# Patient Record
Sex: Male | Born: 1960 | Race: White | Hispanic: No | Marital: Married
Health system: Midwestern US, Community
[De-identification: ages and names within clinical notes are randomized; demographics above are authoritative.]

## PROBLEM LIST (undated history)

## (undated) DIAGNOSIS — M1712 Unilateral primary osteoarthritis, left knee: Secondary | ICD-10-CM

## (undated) DIAGNOSIS — Z96652 Presence of left artificial knee joint: Secondary | ICD-10-CM

## (undated) DIAGNOSIS — Z01818 Encounter for other preprocedural examination: Secondary | ICD-10-CM

## (undated) DIAGNOSIS — Z96651 Presence of right artificial knee joint: Secondary | ICD-10-CM

## (undated) DIAGNOSIS — I10 Essential (primary) hypertension: Secondary | ICD-10-CM

## (undated) DIAGNOSIS — N419 Inflammatory disease of prostate, unspecified: Secondary | ICD-10-CM

## (undated) DIAGNOSIS — M51369 Other intervertebral disc degeneration, lumbar region without mention of lumbar back pain or lower extremity pain: Secondary | ICD-10-CM

## (undated) DIAGNOSIS — F988 Other specified behavioral and emotional disorders with onset usually occurring in childhood and adolescence: Secondary | ICD-10-CM

## (undated) DIAGNOSIS — M5136 Other intervertebral disc degeneration, lumbar region: Secondary | ICD-10-CM

## (undated) DIAGNOSIS — K429 Umbilical hernia without obstruction or gangrene: Secondary | ICD-10-CM

## (undated) HISTORY — PX: LUMBAR DISC SURGERY: SHX700

## (undated) HISTORY — DX: Other specified behavioral and emotional disorders with onset usually occurring in childhood and adolescence: F98.8

## (undated) HISTORY — DX: Other intervertebral disc degeneration, lumbar region: M51.36

## (undated) HISTORY — DX: Essential (primary) hypertension: I10

## (undated) HISTORY — DX: Umbilical hernia without obstruction or gangrene: K42.9

## (undated) HISTORY — DX: Inflammatory disease of prostate, unspecified: N41.9

## (undated) HISTORY — DX: Other intervertebral disc degeneration, lumbar region without mention of lumbar back pain or lower extremity pain: M51.369

---

## 2003-10-19 ENCOUNTER — Emergency Department (HOSPITAL_COMMUNITY): Admission: EM | Admit: 2003-10-19 | Discharge: 2003-10-19 | Payer: Self-pay | Admitting: Emergency Medicine

## 2005-12-02 ENCOUNTER — Ambulatory Visit (HOSPITAL_COMMUNITY): Admission: RE | Admit: 2005-12-02 | Discharge: 2005-12-02 | Payer: Self-pay | Admitting: Orthopedic Surgery

## 2006-08-27 ENCOUNTER — Encounter: Admission: RE | Admit: 2006-08-27 | Discharge: 2006-08-27 | Payer: Self-pay | Admitting: Endocrinology

## 2010-05-09 ENCOUNTER — Encounter: Payer: Self-pay | Admitting: Endocrinology

## 2010-05-17 ENCOUNTER — Encounter: Admission: RE | Admit: 2010-05-17 | Payer: Self-pay | Source: Home / Self Care | Admitting: Endocrinology

## 2010-05-17 ENCOUNTER — Other Ambulatory Visit: Payer: Self-pay | Admitting: Endocrinology

## 2010-05-17 DIAGNOSIS — M545 Low back pain: Secondary | ICD-10-CM

## 2010-05-19 ENCOUNTER — Encounter: Payer: Self-pay | Admitting: Endocrinology

## 2010-05-22 ENCOUNTER — Encounter: Payer: Self-pay | Admitting: Endocrinology

## 2010-06-17 ENCOUNTER — Other Ambulatory Visit: Payer: Self-pay

## 2010-07-09 ENCOUNTER — Ambulatory Visit
Admission: RE | Admit: 2010-07-09 | Discharge: 2010-07-09 | Disposition: A | Discharge status: Home / Self Care | Payer: 59 | Source: Ambulatory Visit | Attending: Endocrinology | Admitting: Endocrinology

## 2010-07-09 DIAGNOSIS — M545 Low back pain: Secondary | ICD-10-CM

## 2010-07-09 MED ORDER — GADOBENATE DIMEGLUMINE 529 MG/ML IV SOLN
20.0000 mL | Freq: Once | INTRAVENOUS | Status: AC | PRN
  Administered 2010-07-09: 20 mL via INTRAVENOUS

## 2011-01-12 ENCOUNTER — Ambulatory Visit (HOSPITAL_BASED_OUTPATIENT_CLINIC_OR_DEPARTMENT_OTHER)
Admission: RE | Admit: 2011-01-12 | Discharge: 2011-01-12 | Disposition: A | Discharge status: Home / Self Care | Payer: 59 | Source: Ambulatory Visit | Attending: Orthopedic Surgery | Admitting: Orthopedic Surgery

## 2011-01-12 DIAGNOSIS — X58XXXA Exposure to other specified factors, initial encounter: Secondary | ICD-10-CM | POA: Insufficient documentation

## 2011-01-12 DIAGNOSIS — Z79899 Other long term (current) drug therapy: Secondary | ICD-10-CM | POA: Insufficient documentation

## 2011-01-12 DIAGNOSIS — M2240 Chondromalacia patellae, unspecified knee: Secondary | ICD-10-CM | POA: Insufficient documentation

## 2011-01-12 DIAGNOSIS — R9431 Abnormal electrocardiogram [ECG] [EKG]: Secondary | ICD-10-CM | POA: Insufficient documentation

## 2011-01-12 DIAGNOSIS — IMO0002 Reserved for concepts with insufficient information to code with codable children: Secondary | ICD-10-CM | POA: Insufficient documentation

## 2011-01-26 NOTE — Op Note (Signed)
Bobby House, Bobby House            ACCOUNT NO.:  [id number]  MEDICAL RECORD NO.:  [id number]  LOCATION:                                 FACILITY:  PHYSICIAN:  Ollen Gross, M.D.         DATE OF BIRTH:  DATE OF PROCEDURE:  01/12/2011 DATE OF DISCHARGE:                              OPERATIVE REPORT   PREOPERATIVE DIAGNOSIS:  Left knee medial meniscal tear.  POSTOPERATIVE DIAGNOSIS:  Left knee medial meniscal tear plus chondral defects.  PROCEDURE:  Left knee arthroscopy, meniscal debridement, and chondroplasty.  SURGEON:  Ollen Gross, M.D.  ASSISTANT:  None.  ANESTHESIA:  General.  ESTIMATED BLOOD LOSS:  Minimal.  DRAINS:  None.  COMPLICATIONS:  None.  CONDITION:  Stable to recovery.  BRIEF CLINICAL NOTE:  The patient is a 50 year old male with a several- month history of significant left knee pain and mechanical symptoms. Exam and history suggested a medial meniscal tear confirmed by MRI.  He presents for arthroscopy and debridement.  PROCEDURE IN DETAIL:  After successful administration of general anesthetic, a tourniquet was placed high on the left thigh, and his left lower extremity was prepped and draped in the usual sterile fashion. Standard superomedial and inferolateral incisions were made, in flow cannula passed, superomedial camera passed inferolateral.  Arthroscopic visualization proceeds.  Undersurface of the patella had some grade 2 and 3 changes.  There was no exposed bone.  There was about 1 x 1 cm area superior and central.  The trochlea looks fairly normal.  Medial and lateral gutters were visualized.  There were no loose bodies. Flexion valgus force was applied to the knee and medial compartment was centered.  There was evidence of a real significant unstable tear of the body and posterior horn of the medial meniscus with a flap component. There was also exposed bone over about a 2 x 2 cm area of the medial femoral condyle with a smaller lesion  on the medial tibial plateau. There was unstable cartilage through this defect.  The spinal needle was used to localize the inferomedial portal and small incision was made, dilator placed.  The meniscus was debrided back to stable base with baskets and a 4.2-mm shaver and sealed off with the ArthroCare device. Collene Mares was also used to debride the unstable cartilage off the medial femoral condyle.  The lesion overall was to 2 x 2 cm.  I then abraded it with the shaver in hopes of getting some fibrocartilage formation.  The rest of the medial compartment looks acceptable.  Intercondylar notch was visualized.  The ACL was intact.  Lateral compartment centered looks normal.  The joint was again inspected.  No other tears, defects, or loose bodies noted.  I did debride the undersurface of the patella to get an unstable area back to a stable base.  The arthroscopic equipment was then removed in the inferior portals, which were closed with interrupted 4-0 nylon.  A 20 cc of 0.25% Marcaine with epi was injected through the inflow cannula and that is removed and that portal closed with nylon.  Bulky sterile dressing was applied and he is awakened and transported to Recovery in stable condition.  Ollen Gross, M.D.     FA/MEDQ  D:  01/12/2011  T:  01/13/2011  Job:  [id number]  Electronically Signed by Ollen Gross M.D. on 01/26/2011 11:18:05 AM

## 2012-09-05 ENCOUNTER — Telehealth: Payer: Self-pay | Admitting: Family Medicine

## 2012-09-05 NOTE — Telephone Encounter (Signed)
PATIENT'S WIFE SAYS HE NEEDS REFILLS AND THAT HE IS READY TO COME AND TALK TO DR. ZANARD.  SHE SAYS THAT NEXT AVAILABLE APPOINTMENT ON 09/14/2012 IS TOO LATE.  HE NEEDS TO BE SEEN MUCH SOONER.

## 2012-09-05 NOTE — Telephone Encounter (Signed)
He can come tomorrow at either 11:45 or 4:00.  I left a message in his wife's voice mail. PG

## 2012-09-06 ENCOUNTER — Encounter: Payer: Self-pay | Admitting: Family Medicine

## 2012-09-06 ENCOUNTER — Ambulatory Visit (INDEPENDENT_AMBULATORY_CARE_PROVIDER_SITE_OTHER): Payer: Commercial Indemnity | Admitting: Family Medicine

## 2012-09-06 VITALS — BP 144/95 | HR 71 | Ht 73.0 in | Wt 250.0 lb

## 2012-09-06 DIAGNOSIS — F988 Other specified behavioral and emotional disorders with onset usually occurring in childhood and adolescence: Secondary | ICD-10-CM

## 2012-09-06 DIAGNOSIS — F4323 Adjustment disorder with mixed anxiety and depressed mood: Secondary | ICD-10-CM

## 2012-09-06 MED ORDER — LISDEXAMFETAMINE DIMESYLATE 70 MG PO CAPS: 70.0000 mg | ORAL_CAPSULE | ORAL | Status: DC

## 2012-09-06 MED ORDER — ESCITALOPRAM OXALATE 10 MG PO TABS: 10.0000 mg | ORAL_TABLET | Freq: Every day | ORAL | Status: DC

## 2012-09-06 NOTE — Progress Notes (Signed)
  Subjective:    Patient ID: Bobby House, male    DOB: Dec 26, 1960, 52 y.o.   MRN: [id number]  HPI  Kyheem is here today to get a refill on his Vyvanse. He is doing fine on his current dosage.  He is eating and sleeping fine. He has been extremely stressed since his last visit.  His brother died unexpectedly a couple of months ago, his mom has just been diagnosed with stage IV lung cancer and his aunt died a few days ago.    Review of Systems  Constitutional: Negative for activity change and unexpected weight change.  Respiratory: Negative for chest tightness.   Cardiovascular: Negative for chest pain.  Psychiatric/Behavioral: Positive for sleep disturbance, dysphoric mood and decreased concentration. The patient is nervous/anxious.    Past Medical History  Diagnosis Date  . ADD (attention deficit disorder)   . Prostatitis   . Umbilical hernia   . DDD (degenerative disc disease), lumbar   . Hypertension    Family History  Problem Relation Age of Onset  . Cancer Mother     Lung Cancer  . Hypertension Father   . Heart disease Paternal Grandfather    History   Social History Narrative   Marital Status: Married Engineering geologist)    Children: Daughter Perrin Maltese); Son Public librarian)    Pets: Dogs (2); Cat (1)    Living Situation: Lives with wife and children   Occupation: Environmental consultant (Scientist, water quality)   Education: Engineer, maintenance (IT)    Alcohol Use:  Social   Tobacco Use:  None    Drug Use:  None   Diet:  Regular   Exercise:  Daily       Objective:   Physical Exam  Constitutional: He appears well-nourished.  HENT:  Head: Normocephalic.  Nose: Nose normal.  Mouth/Throat: Oropharynx is clear and moist.  Eyes: Conjunctivae are normal. No scleral icterus.  Neck: Neck supple. No thyromegaly present.  Cardiovascular: Normal rate, regular rhythm and normal heart sounds.   Pulmonary/Chest: Effort normal and breath sounds normal.  Abdominal: Soft. He exhibits no mass. There is  no tenderness.  Musculoskeletal: Normal range of motion.  Lymphadenopathy:    He has no cervical adenopathy.  Neurological: He is alert.  Skin: Skin is warm and dry. No rash noted.  Psychiatric: He has a normal mood and affect. His behavior is normal. Judgment and thought content normal.       Assessment & Plan:

## 2012-09-06 NOTE — Patient Instructions (Addendum)
1)  ADD - Stay on the Vyvanse.  2)  Mood - Start with 1/2 of the Lexapro for 1-2 weeks then increase to 1 tab if needed. If you do great on the 5 mg then you can stay on that or can even lower it to 2.5 mg (1/4 tab) if needed.    Grief Reaction Grief is a normal response to the death of someone close to you. Feelings of fear, anger, and guilt can affect almost everyone who loses someone they love. Symptoms of depression are also common. These include problems with sleep, loss of appetite, and lack of energy. These grief reaction symptoms often last for weeks to months after a loss. They may also return during special times that remind you of the person you lost, such as an anniversary or birthday. Anxiety, insomnia, irritability, and deep depression may last beyond the period of normal grief. If you experience these feelings for 6 months or longer, you may have clinical depression. Clinical depression requires further medical attention. If you think that you have clinical depression, you should contact your caregiver. If you have a history of depression and or a family history of depression, you are at greater risk of clinical depression. You are also at greater risk of developing clinical depression if the loss was traumatic or the loss was of someone with whom you had unresolved issues.  A grief reaction can become complicated by being blocked. This means being unable to cry or express extreme emotions. This may prolong the grieving period and worsen the emotional effects of the loss. Mourning is a natural event in human life. A healthy grief reaction is one that is not blocked . It requires a time of sadness and readjustment.It is very important to share your sorrow and fear with others, especially close friends and family. Professional counselors and clergy can also help you process your grief. Document Released: 04/04/2005 Document Revised: 06/27/2011 Document Reviewed: 12/13/2005 [facility] Patient  Information 2014 [place], [place]. Anxiety and Panic Attacks Your caregiver has informed you that you are having an anxiety or panic attack. There may be many forms of this. Most of the time these attacks come suddenly and without warning. They come at any time of day, including periods of sleep, and at any time of life. They may be strong and unexplained. Although panic attacks are very scary, they are physically harmless. Sometimes the cause of your anxiety is not known. Anxiety is a protective mechanism of the body in its fight or flight mechanism. Most of these perceived danger situations are actually nonphysical situations (such as anxiety over losing a job). CAUSES  The causes of an anxiety or panic attack are many. Panic attacks may occur in otherwise healthy people given a certain set of circumstances. There may be a genetic cause for panic attacks. Some medications may also have anxiety as a side effect. SYMPTOMS  Some of the most common feelings are:  Intense terror.  Dizziness, feeling faint.  Hot and cold flashes.  Fear of going crazy.  Feelings that nothing is real.  Sweating.  Shaking.  Chest pain or a fast heartbeat (palpitations).  Smothering, choking sensations.  Feelings of impending doom and that death is near.  Tingling of extremities, this may be from over-breathing.  Altered reality (derealization).  Being detached from yourself (depersonalization). Several symptoms can be present to make up anxiety or panic attacks. DIAGNOSIS  The evaluation by your caregiver will depend on the type of symptoms you are  experiencing. The diagnosis of anxiety or panic attack is made when no physical illness can be determined to be a cause of the symptoms. TREATMENT  Treatment to prevent anxiety and panic attacks may include:  Avoidance of circumstances that cause anxiety.  Reassurance and relaxation.  Regular exercise.  Relaxation therapies, such as  yoga.  Psychotherapy with a psychiatrist or therapist.  Avoidance of caffeine, alcohol and illegal drugs.  Prescribed medication. SEEK IMMEDIATE MEDICAL CARE IF:   You experience panic attack symptoms that are different than your usual symptoms.  You have any worsening or concerning symptoms. Document Released: 04/04/2005 Document Revised: 06/27/2011 Document Reviewed: 08/06/2009 [facility] Patient Information 2014 [place], [place]. 1```

## 2012-09-19 ENCOUNTER — Encounter: Payer: Self-pay | Admitting: Family Medicine

## 2012-09-19 DIAGNOSIS — F988 Other specified behavioral and emotional disorders with onset usually occurring in childhood and adolescence: Secondary | ICD-10-CM | POA: Insufficient documentation

## 2012-09-19 DIAGNOSIS — F4323 Adjustment disorder with mixed anxiety and depressed mood: Secondary | ICD-10-CM | POA: Insufficient documentation

## 2012-09-19 NOTE — Assessment & Plan Note (Signed)
Refilled his Vyvanse.

## 2012-09-19 NOTE — Assessment & Plan Note (Signed)
He was given a prescription for Lexapro.

## 2013-01-09 ENCOUNTER — Ambulatory Visit (INDEPENDENT_AMBULATORY_CARE_PROVIDER_SITE_OTHER): Payer: Commercial Indemnity | Admitting: Family Medicine

## 2013-01-09 ENCOUNTER — Encounter: Payer: Self-pay | Admitting: Family Medicine

## 2013-01-09 VITALS — BP 138/92 | HR 73 | Resp 16 | Ht 73.0 in | Wt 250.0 lb

## 2013-01-09 DIAGNOSIS — F411 Generalized anxiety disorder: Secondary | ICD-10-CM | POA: Insufficient documentation

## 2013-01-09 DIAGNOSIS — F988 Other specified behavioral and emotional disorders with onset usually occurring in childhood and adolescence: Secondary | ICD-10-CM | POA: Insufficient documentation

## 2013-01-09 DIAGNOSIS — F4323 Adjustment disorder with mixed anxiety and depressed mood: Secondary | ICD-10-CM

## 2013-01-09 MED ORDER — ESCITALOPRAM OXALATE 10 MG PO TABS: 10.0000 mg | ORAL_TABLET | Freq: Every day | ORAL | Status: DC

## 2013-01-09 MED ORDER — LISDEXAMFETAMINE DIMESYLATE 70 MG PO CAPS: 70.0000 mg | ORAL_CAPSULE | ORAL | Status: DC

## 2013-01-09 NOTE — Assessment & Plan Note (Signed)
Refilled his Lexapro for 6 months.

## 2013-01-09 NOTE — Patient Instructions (Addendum)
1)  ADD - Stay on your current dosage of Vyvanse.  F/U in 3 months.    2)  Depression/Anxiety - Bobby House seems to be handling his mother's death okay.  I think that he is very glad that she is no longer suffering.    3)  Vaccination - He was encouraged to get a flu shot. He is awaiting a voucher from his insurance.

## 2013-01-09 NOTE — Progress Notes (Signed)
  Subjective:    Patient ID: Bobby House, male    DOB: December 14, 1960, 52 y.o.   MRN: [id number]  HPI   Bobby House is here today to get a refills on his Vyvanse and Lexapro.  He feels that his dosages are appropriate.  His mother recently passed away and he admits to feeling more "down" but he says that he is doing okay.  He is glad that he has been on Lexapro.    Review of Systems  Constitutional:       Feeling more down  HENT: Negative.   Eyes: Negative.   Respiratory: Negative.   Cardiovascular: Negative.   Gastrointestinal: Negative.   Endocrine: Negative.   Genitourinary: Negative.   Musculoskeletal: Negative.   Skin: Negative.   Allergic/Immunologic: Negative.   Neurological: Negative.   Hematological: Negative.   Psychiatric/Behavioral: Negative.     Past Medical History  Diagnosis Date  . ADD (attention deficit disorder)   . Prostatitis   . Umbilical hernia   . DDD (degenerative disc disease), lumbar   . Hypertension     Family History  Problem Relation Age of Onset  . Cancer Mother     Lung Cancer  . Hypertension Father   . Heart disease Paternal Grandfather     History   Social History Narrative   Marital Status: Married Educational psychologist)    Children: Daughter Perrin Maltese); Son Public librarian)    Pets: Dogs (2); Cat (1)    Living Situation: Lives with wife and children   Occupation: Environmental consultant (Scientist, water quality)   Education: Engineer, maintenance (IT)    Alcohol Use:  Social   Tobacco Use:  None    Drug Use:  None   Diet:  Regular   Exercise:  Daily       Objective:   Physical Exam  Nursing note and vitals reviewed. Constitutional: He is oriented to person, place, and time. He appears well-developed and well-nourished.  Neck: Normal range of motion.  Cardiovascular: Normal rate.   Pulmonary/Chest: Effort normal.  Abdominal: Soft. Bowel sounds are normal.  Musculoskeletal: Normal range of motion.  Neurological: He is alert and oriented to person, place, and  time.  Skin: Skin is warm and dry.  Psychiatric:  Feeling down      Assessment & Plan:

## 2013-01-09 NOTE — Assessment & Plan Note (Signed)
Refilled his Vyvanse for 3 months.

## 2013-03-27 ENCOUNTER — Ambulatory Visit: Payer: 59 | Admitting: Family Medicine

## 2013-06-12 ENCOUNTER — Ambulatory Visit (INDEPENDENT_AMBULATORY_CARE_PROVIDER_SITE_OTHER): Payer: Commercial Indemnity | Admitting: Family Medicine

## 2013-06-12 ENCOUNTER — Encounter: Payer: Self-pay | Admitting: Family Medicine

## 2013-06-12 VITALS — BP 128/86 | HR 73 | Resp 16 | Wt 252.0 lb

## 2013-06-12 DIAGNOSIS — Z1211 Encounter for screening for malignant neoplasm of colon: Secondary | ICD-10-CM

## 2013-06-12 DIAGNOSIS — F988 Other specified behavioral and emotional disorders with onset usually occurring in childhood and adolescence: Secondary | ICD-10-CM

## 2013-06-12 MED ORDER — LISDEXAMFETAMINE DIMESYLATE 70 MG PO CAPS: 70.0000 mg | ORAL_CAPSULE | ORAL | Status: DC

## 2013-06-12 MED ORDER — LISDEXAMFETAMINE DIMESYLATE 70 MG PO CAPS: 70.0000 mg | ORAL_CAPSULE | ORAL | Status: AC

## 2013-06-26 ENCOUNTER — Ambulatory Visit: Payer: Commercial Indemnity | Admitting: Family Medicine

## 2013-07-29 ENCOUNTER — Encounter: Payer: Self-pay | Admitting: Family Medicine

## 2013-08-02 NOTE — Progress Notes (Signed)
   Subjective:    Patient ID: Bobby House, male    DOB: 06/26/1960, 53 y.o.   MRN: [id number]  HPI  Bobby House is here today to get a refill on his ADD medication (Vyvanse 70 mg).  He says that the medication continues to help him concentrate at work.  He feels that this dosage is appropriate and he would like to continue on it.     Review of Systems  Constitutional: Negative for appetite change.  Psychiatric/Behavioral: Negative for sleep disturbance and decreased concentration.    Past Medical History  Diagnosis Date  . ADD (attention deficit disorder)   . Prostatitis   . Umbilical hernia   . DDD (degenerative disc disease), lumbar   . Hypertension      Past Surgical History  Procedure Laterality Date  . 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 Cancer Mother     Lung Cancer  . Hypertension Father   . Heart disease Paternal Grandfather      Current Outpatient Prescriptions on File Prior to Visit  Medication Sig Dispense Refill  . escitalopram (LEXAPRO) 10 MG tablet Take 1 tablet (10 mg total) by mouth daily.  90 tablet  1   No current facility-administered medications on file prior to visit.     Allergies  Allergen Reactions  . Nuvigil [Armodafinil] Nausea Only     Immunization History  Administered Date(s) Administered  . Tdap 01/13/2009      Objective:   Physical Exam  Constitutional: He is oriented to person, place, and time. He appears well-developed and well-nourished. No distress.  Eyes: No scleral icterus.  Neck: No  thyromegaly present.  Cardiovascular: Normal rate and regular rhythm.   Pulmonary/Chest: Effort normal and breath sounds normal.  Musculoskeletal: He exhibits no edema and no tenderness.  Neurological: He is alert and oriented to person, place, and time.  Skin: Skin is warm and dry.  Psychiatric: He has a normal mood and affect. His behavior is normal. Judgment and thought content normal.      Assessment & Plan:    Ran was seen today for medication management.  Diagnoses and associated orders for this visit:  Attention deficit disorder without mention of hyperactivity Comments:   - lisdexamfetamine (VYVANSE) 70 MG capsule; Take 1 capsule (70 mg total) by mouth every morning.  Colon cancer screening - Ambulatory referral to Gastroenterology

## 2013-11-14 ENCOUNTER — Ambulatory Visit: Payer: Commercial Indemnity | Admitting: Family Medicine

## 2016-01-10 ENCOUNTER — Emergency Department (HOSPITAL_COMMUNITY)
Admission: EM | Admit: 2016-01-10 | Discharge: 2016-01-10 | Disposition: A | Discharge status: Home / Self Care | Payer: 59 | Attending: Emergency Medicine | Admitting: Emergency Medicine

## 2016-01-10 ENCOUNTER — Emergency Department (HOSPITAL_COMMUNITY): Payer: 59

## 2016-01-10 ENCOUNTER — Encounter (HOSPITAL_COMMUNITY): Payer: Self-pay | Admitting: Emergency Medicine

## 2016-01-10 DIAGNOSIS — I10 Essential (primary) hypertension: Secondary | ICD-10-CM | POA: Diagnosis not present

## 2016-01-10 DIAGNOSIS — F909 Attention-deficit hyperactivity disorder, unspecified type: Secondary | ICD-10-CM | POA: Insufficient documentation

## 2016-01-10 DIAGNOSIS — N200 Calculus of kidney: Secondary | ICD-10-CM | POA: Diagnosis not present

## 2016-01-10 DIAGNOSIS — R109 Unspecified abdominal pain: Secondary | ICD-10-CM | POA: Diagnosis present

## 2016-01-10 LAB — CBC
HEMATOCRIT: 40.2 % (ref 39.0–52.0)
HEMOGLOBIN: 13 g/dL (ref 13.0–17.0)
MCH: 22.1 pg — ABNORMAL LOW (ref 26.0–34.0)
MCHC: 32.3 g/dL (ref 30.0–36.0)
MCV: 68.4 fL — ABNORMAL LOW (ref 78.0–100.0)
Platelets: 185 10*3/uL (ref 150–400)
RBC: 5.88 MIL/uL — ABNORMAL HIGH (ref 4.22–5.81)
RDW: 14.8 % (ref 11.5–15.5)
WBC: 13.8 10*3/uL — AB (ref 4.0–10.5)

## 2016-01-10 LAB — COMPREHENSIVE METABOLIC PANEL
ALK PHOS: 67 U/L (ref 38–126)
ALT: 27 U/L (ref 17–63)
ANION GAP: 8 (ref 5–15)
AST: 20 U/L (ref 15–41)
Albumin: 4.4 g/dL (ref 3.5–5.0)
BILIRUBIN TOTAL: 0.7 mg/dL (ref 0.3–1.2)
BUN: 19 mg/dL (ref 6–20)
CALCIUM: 9.3 mg/dL (ref 8.9–10.3)
CO2: 27 mmol/L (ref 22–32)
Chloride: 103 mmol/L (ref 101–111)
Creatinine, Ser: 1.25 mg/dL — ABNORMAL HIGH (ref 0.61–1.24)
GLUCOSE: 139 mg/dL — AB (ref 65–99)
POTASSIUM: 4.4 mmol/L (ref 3.5–5.1)
Sodium: 138 mmol/L (ref 135–145)
TOTAL PROTEIN: 7.2 g/dL (ref 6.5–8.1)

## 2016-01-10 LAB — LIPASE, BLOOD: Lipase: 20 U/L (ref 11–51)

## 2016-01-10 LAB — URINE MICROSCOPIC-ADD ON
Bacteria, UA: NONE SEEN
SQUAMOUS EPITHELIAL / LPF: NONE SEEN

## 2016-01-10 LAB — URINALYSIS, ROUTINE W REFLEX MICROSCOPIC
BILIRUBIN URINE: NEGATIVE
Glucose, UA: NEGATIVE mg/dL
KETONES UR: NEGATIVE mg/dL
LEUKOCYTES UA: NEGATIVE
NITRITE: NEGATIVE
PH: 5 (ref 5.0–8.0)
Protein, ur: NEGATIVE mg/dL
SPECIFIC GRAVITY, URINE: 1.027 (ref 1.005–1.030)

## 2016-01-10 MED ORDER — ONDANSETRON HCL 4 MG/2ML IJ SOLN
4.0000 mg | Freq: Once | INTRAMUSCULAR | Status: AC
  Administered 2016-01-10: 4 mg via INTRAVENOUS
  Filled 2016-01-10: qty 2

## 2016-01-10 MED ORDER — HYDROMORPHONE HCL 1 MG/ML IJ SOLN
1.0000 mg | Freq: Once | INTRAMUSCULAR | Status: AC
Start: 2016-01-10 — End: 2016-01-10
  Administered 2016-01-10: 1 mg via INTRAVENOUS
  Filled 2016-01-10: qty 1

## 2016-01-10 MED ORDER — TAMSULOSIN HCL 0.4 MG PO CAPS: 0.4000 mg | ORAL_CAPSULE | Freq: Every day | ORAL | 0 refills | Status: DC

## 2016-01-10 MED ORDER — ONDANSETRON 4 MG PO TBDP: 4.0000 mg | ORAL_TABLET | Freq: Three times a day (TID) | ORAL | 0 refills | Status: DC | PRN

## 2016-01-10 MED ORDER — ONDANSETRON 4 MG PO TBDP
4.0000 mg | ORAL_TABLET | Freq: Once | ORAL | Status: AC | PRN
  Administered 2016-01-10: 4 mg via ORAL
  Filled 2016-01-10: qty 1

## 2016-01-10 MED ORDER — KETOROLAC TROMETHAMINE 30 MG/ML IJ SOLN
30.0000 mg | Freq: Once | INTRAMUSCULAR | Status: AC
  Administered 2016-01-10: 30 mg via INTRAVENOUS
  Filled 2016-01-10: qty 1

## 2016-01-10 MED ORDER — OXYCODONE-ACETAMINOPHEN 5-325 MG PO TABS: 2.0000 | ORAL_TABLET | ORAL | 0 refills | Status: DC | PRN

## 2016-01-10 NOTE — ED Provider Notes (Signed)
[facility]-EMERGENCY DEPT Provider Note   CSN: [id number] Arrival date & time: 01/10/16  1722     History   Chief Complaint Chief Complaint  Patient presents with  . Flank Pain  . Abdominal Pain    HPI Bobby House is a 55 y.o. male.  The history is provided by the patient. No language interpreter was used.  Flank Pain  This is a new problem. The current episode started 6 to 12 hours ago. The problem occurs constantly. The problem has been gradually worsening. Associated symptoms include abdominal pain. Nothing aggravates the symptoms. Nothing relieves the symptoms. He has tried nothing for the symptoms. The treatment provided moderate relief.  Abdominal Pain    Pt complains of pain in right low back.  Pt complains of abdominal cramping and vomiting.  Pt reports he has had low back soreness for the past week. Pt reports he has a history of a previous spine injury but did not have this much pain  Past Medical History:  Diagnosis Date  . ADD (attention deficit disorder)   . DDD (degenerative disc disease), lumbar   . Hypertension   . Prostatitis   . Umbilical hernia     Patient Active Problem List   Diagnosis Date Noted  . Anxiety state, unspecified 01/09/2013  . Attention deficit disorder without mention of hyperactivity 01/09/2013  . Adjustment disorder with mixed anxiety and depressed mood 09/19/2012    Past Surgical History:  Procedure Laterality Date  . LUMBAR DISC SURGERY         Home Medications    Prior to Admission medications   Medication Sig Start Date End Date Taking? Authorizing Provider  escitalopram (LEXAPRO) 10 MG tablet Take 1 tablet (10 mg total) by mouth daily. 01/09/13 01/09/14  Gillian Scarceobyn K Zanard, MD  [facility]  05/21/13   Historical Provider, MD  lisdexamfetamine (VYVANSE) 70 MG capsule Take 1 capsule (70 mg total) by mouth every morning. 06/12/13 06/12/14  Gillian Scarceobyn K Zanard, MD    Family History Family History  Problem Relation Age of  Onset  . Cancer Mother     Lung Cancer  . Hypertension Father   . Heart disease Paternal Grandfather     Social History Social History  Substance Use Topics  . Smoking status: Never Smoker  . Smokeless tobacco: Never Used  . Alcohol use Not on file     Comment: Drinks "socially"      Allergies   Iohexol and Nuvigil [armodafinil]   Review of Systems Review of Systems  Gastrointestinal: Positive for abdominal pain.  Genitourinary: Positive for flank pain.  All other systems reviewed and are negative.    Physical Exam Updated Vital Signs BP 135/61   Pulse (!) 54   Temp 97.7 F (36.5 C) (Oral)   Resp 20   SpO2 99%   Physical Exam  Constitutional: He appears well-developed and well-nourished.  HENT:  Head: Normocephalic and atraumatic.  Eyes: Conjunctivae are normal.  Neck: Neck supple.  Cardiovascular: Normal rate and regular rhythm.   No murmur heard. Pulmonary/Chest: Effort normal and breath sounds normal. No respiratory distress.  Abdominal: Soft. He exhibits no distension.  Musculoskeletal: He exhibits no edema.  Neurological: He is alert.  Skin: Skin is warm and dry.  Psychiatric: He has a normal mood and affect.  Nursing note and vitals reviewed.    ED Treatments / Results  Labs (all labs ordered are listed, but only abnormal results are displayed) Labs Reviewed  COMPREHENSIVE  METABOLIC PANEL - Abnormal; Notable for the following:       Result Value   Glucose, Bld 139 (*)    Creatinine, Ser 1.25 (*)    All other components within normal limits  CBC - Abnormal; Notable for the following:    WBC 13.8 (*)    RBC 5.88 (*)    MCV 68.4 (*)    MCH 22.1 (*)    All other components within normal limits  URINALYSIS, ROUTINE W REFLEX MICROSCOPIC (NOT AT [facility]) - Abnormal; Notable for the following:    Hgb urine dipstick MODERATE (*)    All other components within normal limits  LIPASE, BLOOD  URINE MICROSCOPIC-ADD ON    EKG  EKG  Interpretation None       Radiology Ct Renal Stone Study  Result Date: 01/10/2016 CLINICAL DATA:  Right-sided flank pain for 1 week.  Nausea. EXAM: CT ABDOMEN AND PELVIS WITHOUT CONTRAST TECHNIQUE: Multidetector CT imaging of the abdomen and pelvis was performed following the standard protocol without oral or intravenous contrast material administration. COMPARISON:  None. FINDINGS: Lower chest: There is patchy atelectasis in the right lower lobe. Lung bases otherwise are clear. Hepatobiliary: No focal liver lesions are evident on this noncontrast enhanced study. Liver has a mildly lobular contour anteriorly. Gallbladder wall is not appreciably thickened. No biliary duct dilatation. Pancreas: Pancreas is largely fatty replaced. No focal pancreatic mass or inflammatory focus is evident. Spleen: No splenic lesion evident. Spleen is normal in size and contour. Adrenals/Urinary Tract: Adrenals appear normal bilaterally. There is a cyst in the upper to mid right kidney measuring 1.7 x 1.6 cm. There is no hydronephrosis on the left. There is mild hydronephrosis on the right with subtle edema of the right kidney. There is no intrarenal calculus on either side. There is a 1 mm calculus in the distal right ureter near the ureterovesical junction. Urinary bladder is midline with wall thickness within normal limits. Stomach/Bowel: There are multiple sigmoid diverticula without diverticulitis. There is no bowel wall or mesenteric thickening. No bowel obstruction. No free air or portal venous air. Vascular/Lymphatic: There is a small amount of calcification in the distal abdominal aorta. There is no abdominal aortic aneurysm. Major mesenteric vessels appear patent on this noncontrast enhanced study. There is no adenopathy appreciable in the abdomen or pelvis. Reproductive: Prostate and seminal vesicles appear unremarkable. No pelvic mass or pelvic fluid collection. Other: Appendix appears normal. There is no ascites or  abscess in the abdomen or pelvis. There is fat in each inguinal ring. There is a small ventral hernia containing only fat. Musculoskeletal: There is degenerative change in the lumbar spine. There is 3 mm of anterolisthesis of L4 on L5. R pars defects at L4 bilaterally. There are no blastic or lytic bone lesions. There is no intramuscular or abdominal wall lesion. IMPRESSION: 1 mm calculus distal right ureter near the ureterovesical junction with mild right-sided hydronephrosis and subtle right renal edema. No bowel obstruction.  No abscess.  Appendix appears normal. Liver as a mildly lobular contour which potentially could indicate a degree of hepatic cirrhosis. Appropriate laboratory correlation advised. Pancreas largely fatty replaced.  No focal pancreatic lesion. Sigmoid diverticulosis without diverticulitis. Small ventral hernia containing only fat. Fat noted in each inguinal ring. Mild spondylolisthesis of L4-5, felt to be due to underlying spondylosis and pars defects at L4 bilaterally. Electronically Signed   By: Bretta Bang III M.D.   On: 01/10/2016 19:38    Procedures Procedures (including critical care  time)  Medications Ordered in ED Medications  HYDROmorphone (DILAUDID) injection 1 mg (not administered)  ketorolac (TORADOL) 30 MG/ML injection 30 mg (not administered)  ondansetron (ZOFRAN-ODT) disintegrating tablet 4 mg (4 mg Oral Given 01/10/16 1744)  HYDROmorphone (DILAUDID) injection 1 mg (1 mg Intravenous Given 01/10/16 1851)  ondansetron (ZOFRAN) injection 4 mg (4 mg Intravenous Given 01/10/16 1851)     Initial Impression / Assessment and Plan / ED Course  I have reviewed the triage vital signs and the nursing notes.  Pertinent labs & imaging results that were available during my care of the patient were reviewed by me and considered in my medical decision making (see chart for details).  Clinical Course   Pt had no relief with dilaudid.  Pt given torodol and repeat dosage  of dilaudid.    Final Clinical Impressions(s) / ED Diagnoses   Final diagnoses:  Kidney stone    New Prescriptions New Prescriptions   ONDANSETRON (ZOFRAN ODT) 4 MG DISINTEGRATING TABLET    Take 1 tablet (4 mg total) by mouth every 8 (eight) hours as needed for nausea or vomiting.   OXYCODONE-ACETAMINOPHEN (PERCOCET/ROXICET) 5-325 MG TABLET    Take 2 tablets by mouth every 4 (four) hours as needed for severe pain.   TAMSULOSIN (FLOMAX) 0.4 MG CAPS CAPSULE    Take 1 capsule (0.4 mg total) by mouth daily.     Lonia Skinner [place], PA-C 01/10/16 2030    Raeford Razor, MD 01/10/16 [phone]

## 2016-01-10 NOTE — ED Notes (Signed)
Successful IV attempt but was unable to draw blood.  Phlebotomy to attempt.

## 2016-01-10 NOTE — ED Triage Notes (Signed)
Pt reports R flank/abd pain for the past week. Today after lunch, pain became worse and pt started having nausea. No dysuria/hematuria or v/d.

## 2017-05-22 ENCOUNTER — Encounter: Payer: Self-pay | Admitting: Sports Medicine

## 2017-05-29 ENCOUNTER — Ambulatory Visit: Payer: Commercial Indemnity | Admitting: Sports Medicine

## 2018-12-11 DIAGNOSIS — F419 Anxiety disorder, unspecified: Secondary | ICD-10-CM | POA: Diagnosis not present

## 2018-12-11 DIAGNOSIS — M549 Dorsalgia, unspecified: Secondary | ICD-10-CM | POA: Diagnosis not present

## 2018-12-11 DIAGNOSIS — M170 Bilateral primary osteoarthritis of knee: Secondary | ICD-10-CM | POA: Diagnosis not present

## 2018-12-11 DIAGNOSIS — N200 Calculus of kidney: Secondary | ICD-10-CM | POA: Diagnosis not present

## 2018-12-13 DIAGNOSIS — N200 Calculus of kidney: Secondary | ICD-10-CM | POA: Diagnosis not present

## 2018-12-13 DIAGNOSIS — N281 Cyst of kidney, acquired: Secondary | ICD-10-CM | POA: Diagnosis not present

## 2018-12-13 DIAGNOSIS — Z87442 Personal history of urinary calculi: Secondary | ICD-10-CM | POA: Diagnosis not present

## 2018-12-13 DIAGNOSIS — R109 Unspecified abdominal pain: Secondary | ICD-10-CM | POA: Diagnosis not present

## 2018-12-13 DIAGNOSIS — R1030 Lower abdominal pain, unspecified: Secondary | ICD-10-CM | POA: Diagnosis not present

## 2018-12-25 DIAGNOSIS — G4700 Insomnia, unspecified: Secondary | ICD-10-CM | POA: Diagnosis not present

## 2018-12-25 DIAGNOSIS — M549 Dorsalgia, unspecified: Secondary | ICD-10-CM | POA: Diagnosis not present

## 2018-12-25 DIAGNOSIS — F900 Attention-deficit hyperactivity disorder, predominantly inattentive type: Secondary | ICD-10-CM | POA: Diagnosis not present

## 2019-02-18 DIAGNOSIS — Z111 Encounter for screening for respiratory tuberculosis: Secondary | ICD-10-CM | POA: Diagnosis not present

## 2019-03-01 DIAGNOSIS — E785 Hyperlipidemia, unspecified: Secondary | ICD-10-CM | POA: Diagnosis not present

## 2019-03-01 DIAGNOSIS — I10 Essential (primary) hypertension: Secondary | ICD-10-CM | POA: Diagnosis not present

## 2019-03-01 DIAGNOSIS — Z79899 Other long term (current) drug therapy: Secondary | ICD-10-CM | POA: Diagnosis not present

## 2019-03-04 DIAGNOSIS — E785 Hyperlipidemia, unspecified: Secondary | ICD-10-CM | POA: Diagnosis not present

## 2019-03-04 DIAGNOSIS — F419 Anxiety disorder, unspecified: Secondary | ICD-10-CM | POA: Diagnosis not present

## 2019-03-04 DIAGNOSIS — M549 Dorsalgia, unspecified: Secondary | ICD-10-CM | POA: Diagnosis not present

## 2019-11-14 DIAGNOSIS — Z79899 Other long term (current) drug therapy: Secondary | ICD-10-CM | POA: Diagnosis not present

## 2019-11-14 DIAGNOSIS — E785 Hyperlipidemia, unspecified: Secondary | ICD-10-CM | POA: Diagnosis not present

## 2019-11-14 DIAGNOSIS — Z0000 Encounter for general adult medical examination without abnormal findings: Secondary | ICD-10-CM | POA: Diagnosis not present

## 2020-04-01 DIAGNOSIS — R7309 Other abnormal glucose: Secondary | ICD-10-CM | POA: Diagnosis not present

## 2020-04-01 DIAGNOSIS — G4700 Insomnia, unspecified: Secondary | ICD-10-CM | POA: Diagnosis not present

## 2020-04-01 DIAGNOSIS — F900 Attention-deficit hyperactivity disorder, predominantly inattentive type: Secondary | ICD-10-CM | POA: Diagnosis not present

## 2020-11-11 DIAGNOSIS — L237 Allergic contact dermatitis due to plants, except food: Secondary | ICD-10-CM | POA: Diagnosis not present

## 2020-11-20 DIAGNOSIS — Z125 Encounter for screening for malignant neoplasm of prostate: Secondary | ICD-10-CM | POA: Diagnosis not present

## 2020-11-20 DIAGNOSIS — Z0000 Encounter for general adult medical examination without abnormal findings: Secondary | ICD-10-CM | POA: Diagnosis not present

## 2020-11-26 DIAGNOSIS — G4700 Insomnia, unspecified: Secondary | ICD-10-CM | POA: Diagnosis not present

## 2020-11-26 DIAGNOSIS — D569 Thalassemia, unspecified: Secondary | ICD-10-CM | POA: Diagnosis not present

## 2020-11-26 DIAGNOSIS — Z0000 Encounter for general adult medical examination without abnormal findings: Secondary | ICD-10-CM | POA: Diagnosis not present

## 2020-11-26 DIAGNOSIS — F900 Attention-deficit hyperactivity disorder, predominantly inattentive type: Secondary | ICD-10-CM | POA: Diagnosis not present

## 2021-05-27 NOTE — Progress Notes (Signed)
Subjective:    CC: L knee pain  I, Molly Weber, LAT, ATC, am serving as scribe for Dr. Clementeen Graham.  HPI: Pt is a 61 y/o male c/o L knee pain x a couple weeks.  He locates his pain to .  He has a hx of L knee arthrscopic sx for a meniscal tear on 01/12/11 performed by Dr. Lequita Halt. Pt locates pain to the posterior aspect of the L knee. Pt c/o increased pain at night.  L knee swelling: yes L knee mechanical symptoms: no Aggravating factors: deep knee flexion Treatments tried: NSAIDs, stretching   Pertinent review of Systems: no fever or chills  Relevant historical information: ADHD   Objective:    Vitals:   05/28/21 0843  BP: (!) 160/88  Pulse: (!) 105  SpO2: 96%   General: Well Developed, well nourished, and in no acute distress.   MSK: Left knee normal appearing no effusion. Normal motion with crepitation. Palpable swelling posterior lateral knee. Tender palpation medial joint line. Stable ligamentous exam. Positive medial McMurray's test. Intact strength.   Lab and Radiology Results  Procedure: Real-time Ultrasound Guided aspiration and injection of anterior lateral ganglion cyst Device: Philips Affiniti 50G Images permanently stored and available for review in PACS Ultrasound evaluation prior to injection reveals no significant joint effusion.  Narrowed medial joint line.  No Baker's cyst however cystic structure present in the posterior lateral knee without pulsatile change or Doppler flow. Verbal informed consent obtained.  Discussed risks and benefits of procedure. Warned about infection bleeding damage to structures skin hypopigmentation and fat atrophy among others. Patient expresses understanding and agreement Time-out conducted.   Noted no overlying erythema, induration, or other signs of local infection.   Skin prepped in a sterile fashion.   Local anesthesia: Topical Ethyl chloride.   With sterile technique and under real time ultrasound  guidance: 2 mL of lidocaine injected into subcutaneous tissue and along plan aspiration tract to cyst. Skin was again sterilized with isopropyl alcohol. 18-gauge needle was used to access the cyst. Thick clear to faintly straw-colored gelatinous material was aspirated totaling 12 mL. The needle was repositioned several times under ultrasound guidance but no further material was able to be aspirated despite the cyst being approximately 70% decompressed. Syringe was exchanged and 40 mg of Kenalog and 2 mL of Marcaine was injected into the ganglion cyst. Fluid seen entering the ganglion cyst.   Completed without difficulty   Pain immediately resolved suggesting accurate placement of the medication.   Advised to call if fevers/chills, erythema, induration, drainage, or persistent bleeding.   Images permanently stored and available for review in the ultrasound unit.  Impression: Technically successful ultrasound guided injection.   X-ray images left knee obtained today personally and independently interpreted Severe medial compartment DJD.  Moderate patellofemoral DJD.  No acute fractures. Await formal radiology review    Impression and Recommendations:    Assessment and Plan: 61 y.o. male with posterior lateral knee pain and swelling revealed to be a ganglion cyst after aspiration and injection.  Unfortunately the cyst was not fully decompressed after an aspiration however was mostly decompressed.  He does feel better after his aspiration and injection today I am hopeful that this will provide some lasting benefit.  He does have a fair amount of arthritis present in his knee which may be a source of continued pain in the future.  Certainly an intra-articular steroid injection could be helpful as well as hyaluronic acid injections.  Recommend  Voltaren gel and compression.  Recheck back with me as needed.Marland Kitchen  PDMP not reviewed this encounter. Orders Placed This Encounter  Procedures   [place]  LIMITED JOINT SPACE STRUCTURES LOW LEFT(NO LINKED CHARGES)    Standing Status:   Future    Number of Occurrences:   1    Standing Expiration Date:   11/25/2021    Order Specific Question:   Reason for Exam (SYMPTOM  OR DIAGNOSIS REQUIRED)    Answer:   left knee pain    Order Specific Question:   Preferred imaging location?    Answer:   [facility] Knee AP/LAT W/[place] Left    Standing Status:   Future    Number of Occurrences:   1    Standing Expiration Date:   05/28/2022    Order Specific Question:   Reason for Exam (SYMPTOM  OR DIAGNOSIS REQUIRED)    Answer:   left knee pain    Order Specific Question:   Preferred imaging location?    Answer:   [facility]   No orders of the defined types were placed in this encounter.   Discussed warning signs or symptoms. Please see discharge instructions. Patient expresses understanding.   The above documentation has been reviewed and is accurate and complete Clementeen Graham, M.D.

## 2021-05-28 ENCOUNTER — Ambulatory Visit: Payer: BC Managed Care – PPO | Admitting: Family Medicine

## 2021-05-28 ENCOUNTER — Ambulatory Visit: Payer: Self-pay

## 2021-05-28 ENCOUNTER — Ambulatory Visit (INDEPENDENT_AMBULATORY_CARE_PROVIDER_SITE_OTHER): Payer: BC Managed Care – PPO

## 2021-05-28 ENCOUNTER — Encounter: Payer: Self-pay | Admitting: Family Medicine

## 2021-05-28 ENCOUNTER — Other Ambulatory Visit: Payer: Self-pay

## 2021-05-28 VITALS — BP 160/88 | HR 105 | Ht 73.0 in | Wt 236.8 lb

## 2021-05-28 DIAGNOSIS — M1712 Unilateral primary osteoarthritis, left knee: Secondary | ICD-10-CM | POA: Diagnosis not present

## 2021-05-28 DIAGNOSIS — M25562 Pain in left knee: Secondary | ICD-10-CM

## 2021-05-28 DIAGNOSIS — M6740 Ganglion, unspecified site: Secondary | ICD-10-CM

## 2021-05-28 NOTE — Patient Instructions (Addendum)
Thank you for coming in today.   We aspirated and you received a steroid injection in your left knee today. Seek immediate medical attention if the joint becomes red, extremely painful, or is oozing fluid.   Please get an Xray today before you leave   Please use Voltaren gel (Generic Diclofenac Gel) up to 4x daily for pain as needed.  This is available over-the-counter as both the name brand Voltaren gel and the generic diclofenac gel.   Recheck back as needed

## 2021-05-31 NOTE — Progress Notes (Signed)
Left knee x-ray shows medium to severe arthritis changes.

## 2021-07-28 ENCOUNTER — Ambulatory Visit: Payer: Self-pay

## 2021-07-28 ENCOUNTER — Ambulatory Visit (INDEPENDENT_AMBULATORY_CARE_PROVIDER_SITE_OTHER): Payer: BC Managed Care – PPO | Admitting: Family Medicine

## 2021-07-28 VITALS — BP 148/86 | HR 103 | Ht 73.0 in | Wt 240.0 lb

## 2021-07-28 DIAGNOSIS — M6740 Ganglion, unspecified site: Secondary | ICD-10-CM

## 2021-07-28 DIAGNOSIS — M1712 Unilateral primary osteoarthritis, left knee: Secondary | ICD-10-CM

## 2021-07-28 DIAGNOSIS — M898X9 Other specified disorders of bone, unspecified site: Secondary | ICD-10-CM

## 2021-07-28 MED ORDER — GABAPENTIN 300 MG PO CAPS: 300.0000 mg | ORAL_CAPSULE | Freq: Every evening | ORAL | 3 refills | Status: AC | PRN

## 2021-07-28 NOTE — Progress Notes (Signed)
? ?I, Philbert Riser, LAT, ATC acting as a scribe for Clementeen Graham, MD. ? ?Bobby House is a 61 y.o. male who presents to [facility] at [facility] today for continued L knee pain and swelling. Pt was last seen by Dr. Denyse Amass on 05/28/21 and a ganglion cyst was aspirated and injection. Pt was advised to use Voltaren gel and compression. Today, pt reports pain in his L knee has just returned recently. Pt c/o pain into the L calf and foot that is bothersome at night. Pt still locates pain to the posterior aspect of the L knee. ? ?Dx imaging: 05/28/21 L knee XR ? ?Pertinent review of systems: No fevers or chills ? ?Relevant historical information: History of ADHD ? ? ?Exam:  ?BP (!) 148/86   Pulse (!) 103   Ht 6\' 1"  (1.854 m)   Wt 240 lb (108.9 kg)   SpO2 97%   BMI 31.66 kg/m?  ?General: Well Developed, well nourished, and in no acute distress.  ? ?MSK: Left knee normal. ?Palpable fullness posterior lateral knee. ?Range of motion decreased flexion to 110 degrees.  Otherwise range of motion is intact. ?Intact strength. ? ? ? ?Lab and Radiology Results ? ?Diagnostic Limited MSK Ultrasound of: Posterior lateral knee ?Complicated appearing cystic structure posterior lateral knee with multiple loculations and some solid structures associated with cystic structures.  This looks like to be a complicated appearing loculated ganglion cyst versus an atypical mass. ?Not much vascular flow present within the cystic structure. ?Impression: Posterior knee mass ? ? ?EXAM: ?LEFT KNEE 3 VIEWS ?  ?COMPARISON:  None. ?  ?FINDINGS: ?No evidence of fracture, dislocation, or joint effusion. Severe ?narrowing of medial joint space is noted. Moderate narrowing of ?patellofemoral space is noted with osteophyte formation. Soft ?tissues are unremarkable. ?  ?IMPRESSION: ?Moderate to severe degenerative joint disease as described above. No ?acute abnormality is noted. ?  ?  ?Electronically Signed ?  By:  M.D. ?  On: 05/30/2021 11:11 ?  ?I, 07/28/2021, personally (independently) visualized and performed the interpretation of the images attached in this note. ? ? ? ?Assessment and Plan: ?61 y.o. male with left posterior lateral knee mass.  I think this is probably just a ganglion cyst but it does not typical in appearance for a usual ganglion cyst.  It is much too loculated and has more solid components that I would expect for a ganglion cyst.  I think at this point prior to reaspiration we should obtain an MRI to further characterize the mass.  This should help determine what it is exactly and whether or not continued aspiration attempts are safe.  He does have significant degenerative changes in the knee and ultimately will need a knee replacement. ? ?As for the radicular component is primarily bothersome at bedtime.  Limited gabapentin for now. ? ? ?PDMP not reviewed this encounter. ?Orders Placed This Encounter  ?Procedures  ? 46 LIMITED JOINT SPACE STRUCTURES LOW LEFT(NO LINKED CHARGES)  ?  Order Specific Question:   Reason for Exam (SYMPTOM  OR DIAGNOSIS REQUIRED)  ?  Answer:   left knee pain  ?  Order Specific Question:   Preferred imaging location?  ?  Answer:   [place] [facility]  ? MR KNEE LEFT WO CONTRAST  ?  Standing Status:   Future  ?  Standing Expiration Date:   08/27/2021  ?  Order Specific Question:   What is the patient's sedation requirement?  ?  Answer:   No Sedation  ?  Order Specific Question:   Does the patient have a pacemaker or implanted devices?  ?  Answer:   No  ?  Order Specific Question:   Preferred imaging location?  ?  Answer:   [facility] (table limit-350lbs)  ? ?Meds ordered this encounter  ?Medications  ? gabapentin (NEURONTIN) 300 MG capsule  ?  Sig: Take 1-2 capsules (300-600 mg total) by mouth at bedtime as needed.  ?  Dispense:  90 capsule  ?  Refill:  3  ? ? ? ?Discussed warning signs or symptoms. Please see discharge instructions. Patient expresses  understanding. ? ? ?The above documentation has been reviewed and is accurate and complete Clementeen Graham, M.D. ? ? ?

## 2021-07-28 NOTE — Patient Instructions (Addendum)
Thank you for coming in today.  ? ?I've sent a prescription for Gabapentin to your pharmacy.  ? ?You should hear from MRI scheduling within 1 week. If you do not hear please let me know.   ? ?Recheck back after MRI. ? ? ?

## 2021-08-09 ENCOUNTER — Ambulatory Visit (INDEPENDENT_AMBULATORY_CARE_PROVIDER_SITE_OTHER): Payer: BC Managed Care – PPO

## 2021-08-09 DIAGNOSIS — M1712 Unilateral primary osteoarthritis, left knee: Secondary | ICD-10-CM | POA: Diagnosis not present

## 2021-08-09 DIAGNOSIS — M898X9 Other specified disorders of bone, unspecified site: Secondary | ICD-10-CM | POA: Diagnosis not present

## 2021-08-09 NOTE — Progress Notes (Signed)
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  Reconstruction of the ACL probably is not a good idea as I do not think that would fix much of your problems. ? ?You do have more severe arthritis and spots on your knee on MRI. ? ?You also have a medial and lateral meniscus tear that could be contributing to some inflammation inside your knee. ? ?You have a small Baker's cyst and some area where the knee joint pushes posteriorly which may be what we are seeing. ? ?We can continue aspiration and injection intermittently up to every 3 months although I think were getting to the point now where steroid injections are going to be less helpful.  We can try gel shots or intermittent steroid injections.  Recommend return to clinic to go over the results of full detail.   ? ?Let me know if you would like me to authorize gel shots.

## 2021-08-10 NOTE — Progress Notes (Signed)
? ?I, Christoper Fabian, LAT, ATC, am serving as scribe for Dr. Clementeen Graham. ? ?Bobby House is a 61 y.o. male who presents to [facility] at [facility] today for f/u of L knee pain and to review his L knee MRI.  He was last seen by Dr. Denyse Amass on 07/28/21 and was referred for an MRI and prescribed Gabapentin.  Today, pt reports that his L knee is about the same/ slightly improved. ? ?Dx imaging: L knee MRI- 08/09/21; 05/28/21 L knee XR ? ?Pertinent review of systems: No fevers or chills ? ?Relevant historical information: ADHD ? ? ?Exam:  ?BP 138/70 (BP Location: Right Arm, Patient Position: Sitting, Cuff Size: Large)   Pulse (!) 113   Ht 6\' 1"  (1.854 m)   Wt 240 lb (108.9 kg)   SpO2 97%   BMI 31.66 kg/m?  ?General: Well Developed, well nourished, and in no acute distress.  ? ?MSK: Left knee mild effusion normal motion with crepitation. ? ? ? ?Lab and Radiology Results ?No results found for this or any previous visit (from the past 72 hour(s)). ?MR KNEE LEFT WO CONTRAST ? ?Result Date: 08/09/2021 ?CLINICAL DATA:  Posterior left knee pain for 1 month. EXAM: MRI OF THE LEFT KNEE WITHOUT CONTRAST TECHNIQUE: Multiplanar, multisequence MR imaging of the knee was performed. No intravenous contrast was administered. COMPARISON:  None. FINDINGS: MENISCI Medial: Maceration of the medial meniscus. Lateral: Radial tear of the posterior horn of the lateral meniscus at the meniscal root. LIGAMENTS Cruciates: Complete chronic ACL tear. Degeneration of the PCL which is otherwise intact. Collaterals: Medial collateral ligament is intact. Lateral collateral ligament complex is intact. CARTILAGE Patellofemoral: Partial-thickness cartilage loss of the patellofemoral compartment with marginal osteophytes. Medial: Extensive full-thickness cartilage loss of the medial femorotibial compartment subchondral reactive marrow edema and small marginal osteophytes. Lateral: With partial-thickness cartilage loss of the lateral  femorotibial compartment with small marginal osteophytes. JOINT: Small joint effusion. Normal Hoffa's fat-pad. No plical thickening. 5.6 x 3.8 x 4.6 cm outpouching of the posterior joint capsule with synovitis. Mild osteoarthritis of the proximal tibia-fibular joint. POPLITEAL FOSSA: Popliteus tendon is intact. Tiny Baker's cyst. EXTENSOR MECHANISM: Intact quadriceps tendon. Intact patellar tendon. Intact lateral patellar retinaculum. Intact medial patellar retinaculum. Intact MPFL. BONES: No aggressive osseous lesion. No fracture or dislocation. Other: No fluid collection or hematoma. Muscles are normal. IMPRESSION: 1. Maceration of the medial meniscus. 2. Radial tear of the posterior horn of the lateral meniscus at the meniscal root. 3. Tricompartmental cartilage abnormalities as described above most severe in the medial femorotibial compartment. 4. Complete chronic ACL tear. Electronically Signed   By: 08/11/2021 M.D.   On: 08/09/2021 12:29   ? ? ?I, 08/11/2021, personally (independently) visualized and performed the interpretation of the images attached in this note. ? ? ?Assessment and Plan: ?61 y.o. male with left knee pain and posterior knee effusion. ?Patient has significant medial compartment degenerative changes seen on MRI.  He also has a chronic ACL tear and a large posterior knee effusion which is a bit unusual.  He has had trials of conservative management and we spent time talking about his options.  I think knee replacement is probably going to be his best option.  He notes that he has tried gel shots in the past with mediocre results.  Orthovisc is approved and we could do that at some point if needed.  He is going to do some research on which surgeon he wants to see and  will let me know. ? ?Recheck back as needed. ?Total encounter time 20 minutes including face-to-face time with the patient and, reviewing past medical record, and charting on the date of service.   ?Reviewed MRI and discussed  treatment plan and options. ? ? ? ?Discussed warning signs or symptoms. Please see discharge instructions. Patient expresses understanding. ? ? ?The above documentation has been reviewed and is accurate and complete Clementeen Graham, M.D. ? ? ?

## 2021-08-11 ENCOUNTER — Ambulatory Visit: Payer: BC Managed Care – PPO | Admitting: Family Medicine

## 2021-08-11 ENCOUNTER — Encounter: Payer: Self-pay | Admitting: Family Medicine

## 2021-08-11 VITALS — BP 138/70 | HR 113 | Ht 73.0 in | Wt 240.0 lb

## 2021-08-11 DIAGNOSIS — M1712 Unilateral primary osteoarthritis, left knee: Secondary | ICD-10-CM

## 2021-08-11 DIAGNOSIS — G8929 Other chronic pain: Secondary | ICD-10-CM | POA: Diagnosis not present

## 2021-08-11 DIAGNOSIS — M25562 Pain in left knee: Secondary | ICD-10-CM | POA: Diagnosis not present

## 2021-08-11 NOTE — Patient Instructions (Addendum)
Good to see you today. ? ?Let me know how you'd like to proceed once you do some more research. ? ?Follow-up: as needed ?

## 2021-08-26 ENCOUNTER — Ambulatory Visit: Admit: 2021-08-26 | Discharge: 2021-08-26 | Payer: BLUE CROSS/BLUE SHIELD | Attending: Orthopaedic Surgery

## 2021-08-26 DIAGNOSIS — M1712 Unilateral primary osteoarthritis, left knee: Secondary | ICD-10-CM

## 2021-08-26 NOTE — Progress Notes (Unsigned)
Name: Travis Montgomery    DOB: 04-12-1961     [facility]  [place] [facility]  [address], [place]  [place] [address]  Dept: [phone]  Dept Fax: [phone]     Chief Complaint   Patient presents with    Knee Pain        Ht 6\' 1"  (1.854 m)   Wt 240 lb (108.9 kg)   BMI 31.66 kg/m      Allergies   Allergen Reactions    Eszopiclone      Other reaction(s): ineffective    Iodinated Contrast Media Other (See Comments)    Iohexol     Modafinil      Other reaction(s): Unknown    Suvorexant      Other reaction(s): ineffective    Temazepam      Other reaction(s): ineffective    Trazodone Hcl      Other reaction(s): depression    Zolpidem Tartrate      Other reaction(s): forgetful    Armodafinil Nausea Only        Current Outpatient Medications   Medication Sig Dispense Refill    diazePAM (VALIUM) 5 MG tablet       gabapentin (NEURONTIN) 300 MG capsule       HYDROcodone-acetaminophen (NORCO) 5-325 MG per tablet       VYVANSE 70 MG capsule        No current facility-administered medications for this visit.      Patient Active Problem List   Diagnosis    Backache    Attention deficit disorder    Anxiety disorder    Adjustment disorder with mixed anxiety and depressed mood    Osteoarthritis of knee    Nephrolithiasis    Hyperlipidemia    Ganglion cyst    Primary insomnia      Family History   Problem Relation Age of Onset    Cancer Mother       Social History     Socioeconomic History    Marital status: Married     Spouse name: None    Number of children: None    Years of education: None    Highest education level: None   Tobacco Use    Smoking status: Never    Smokeless tobacco: Never   Substance and Sexual Activity    Alcohol use: Yes      No past surgical history on file.   History reviewed. No pertinent past medical history.     I have reviewed and agree with PFSH and ROS and intake form in chart and the record furthermore I have reviewed prior  medical record(s) regarding this patients care during this appointment.     Review of Systems:   Patient is a pleasant appearing individual, appropriately dressed, well hydrated, well nourished, who is alert, appropriately oriented for age, and in no acute distress with a normal gait and normal affect who does not appear to be in any significant pain.    Physical Exam:  Left Knee -Decrease range of motion with flexion, Knee arc of greater than 50 degrees, Some crepitation, Grossly neurovascularly intact, Good cap refill, No skin lesion, Moderate swelling, some gross instability, Some quadriceps weakness Kellgren and Lawrence at least grade 3    Right Knee -Decrease range of motion with flexion, Some crepitation, Grossly neurovascularly intact, Good cap refill, No skin lesion, Moderate swelling, some gross instability, Some quadriceps weaknessKellgren and [employer]  at least grade 3    Visit Diagnoses         Codes    Pre-op testing     Z01.818               HPI:  The patient is here with a chief complaint of bilateral knee pain, throbbing, burning pain, progressively getting worse.  Pain is 9/10.  Failed conservative treatment.    X-rays of bilateral knees are positive for severe OA.    Assessment/Plan:  Plan will be for left total knee replacement, 2 months later right total knee replacement.  General medical clearance at [facility].  No history of blood clots. Does not take any blood thinners and no cardiac history and we will go from there.    As part of continued conservative pain management options the patient was advised to utilize Tylenol or OTC NSAIDS as long as it is not medically contraindicated.     Return to Office:    Follow-up and Dispositions    Return for SCHEDULE FOR SURGERY.           Scribed by Diona Foley, LPN as dictated by [facility] A. Allena Katz, MD.  Documentation, performed by, True and Accepted Manish A. Allena Katz, MD

## 2021-08-26 NOTE — Patient Instructions (Signed)
Knee Arthritis: Care Instructions  Overview     Knee arthritis is a breakdown of the cartilage that cushions your knee joint. When the cartilage wears down, your bones rub against each other. This causes pain and stiffness. Knee arthritis tends to get worse with time.  Treatment for knee arthritis involves reducing pain, making the leg muscles stronger, and staying at a healthy body weight. The treatment usually does not improve the health of the cartilage, but it can reduce pain and improve how well your knee works.  You can take simple measures to protect your knee joints, ease your pain, and help you stay active.  Follow-up care is a key part of your treatment and safety. Be sure to make and go to all appointments, and call your doctor if you are having problems. It's also a good idea to know your test results and keep a list of the medicines you take.  How can you care for yourself at home?  Know that knee arthritis will cause more pain on some days than on others.  Stay at a healthy weight. Lose weight if you are overweight. When you stand up, the pressure on your knees from every pound of body weight is multiplied four times. So if you lose 10 pounds, you will reduce the pressure on your knees by 40 pounds.  Talk to your doctor or physical therapist about exercises that will help ease joint pain.  Stretch to help prevent stiffness and to prevent injury before you exercise. You may enjoy gentle forms of yoga to help keep your knee joints and muscles flexible.  Walk instead of jog.  Ride a bike. This makes your thigh muscles stronger and takes pressure off your knee.  Wear well-fitting and comfortable shoes.  Exercise in chest-deep water. This can help you exercise longer with less pain.  Avoid exercises that include squatting or kneeling. They can put a lot of strain on your knees.  Talk to your doctor to make sure that the exercise you do is not making the arthritis worse.  Do not sit for long periods of time.  Try to walk once in a while to keep your knee from getting stiff.  Ask your doctor or physical therapist whether shoe inserts may reduce your arthritis pain.  If you can afford it, get new athletic shoes at least every year. This can help reduce the strain on your knees.  Use a device to help you do everyday activities.  A cane or walking stick can help you keep your balance when you walk. Hold the cane or walking stick in the hand opposite the painful knee.  If you feel like you may fall when you walk, try using crutches or a front-wheeled walker. These can prevent falls that could cause more damage to your knee.  A knee brace may help keep your knee stable and prevent pain.  You also can use other things to make life easier, such as a higher toilet seat and handrails in the bathtub or shower.  Take pain medicines exactly as directed.  Do not wait until you are in severe pain. You will get better results if you take it sooner.  If you are not taking a prescription pain medicine, take an over-the-counter medicine such as acetaminophen (Tylenol), ibuprofen (Advil, Motrin), or naproxen (Aleve). Read and follow all instructions on the label.  Do not take two or more pain medicines at the same time unless the doctor told you to. Many pain   medicines have acetaminophen, which is Tylenol. Too much acetaminophen (Tylenol) can be harmful.  Tell your doctor if you take a blood thinner, have diabetes, or have allergies to shellfish.  Ask your doctor if you might benefit from a shot of steroid medicine into your knee. This may provide pain relief for several months.  Many people take the supplements glucosamine and chondroitin for osteoarthritis. Some people feel they help, but the medical research does not show that they work. Talk to your doctor before you take these supplements.  When should you call for help?   Call your doctor now or seek immediate medical care if:    You have sudden swelling, warmth, or pain in your knee.      You have knee pain and a fever or rash.     You have such bad pain that you cannot use your knee.   Watch closely for changes in your health, and be sure to contact your doctor if you have any problems.  Where can you learn more?  Go to https://www.healthwise.net/patientEd and enter W187 to learn more about "Knee Arthritis: Care Instructions."  Current as of: June 24, 2020               Content Version: 13.5   2006-2022 Healthwise, Incorporated.   Care instructions adapted under license by [facility]. If you have questions about a medical condition or this instruction, always ask your healthcare professional. Healthwise, Incorporated disclaims any warranty or liability for your use of this information.

## 2021-08-27 ENCOUNTER — Encounter

## 2021-09-02 ENCOUNTER — Other Ambulatory Visit: Payer: Self-pay | Admitting: Orthopedic Surgery

## 2021-09-02 ENCOUNTER — Ambulatory Visit (INDEPENDENT_AMBULATORY_CARE_PROVIDER_SITE_OTHER): Payer: BC Managed Care – PPO

## 2021-09-02 DIAGNOSIS — Z01818 Encounter for other preprocedural examination: Secondary | ICD-10-CM | POA: Diagnosis not present

## 2021-09-02 DIAGNOSIS — M1712 Unilateral primary osteoarthritis, left knee: Secondary | ICD-10-CM | POA: Diagnosis not present

## 2021-11-04 ENCOUNTER — Encounter

## 2021-11-15 ENCOUNTER — Ambulatory Visit: Admit: 2021-11-15 | Discharge: 2021-11-15 | Payer: BLUE CROSS/BLUE SHIELD | Attending: Orthopaedic Surgery

## 2021-11-15 DIAGNOSIS — M1712 Unilateral primary osteoarthritis, left knee: Secondary | ICD-10-CM

## 2021-11-15 MED ORDER — ONDANSETRON 8 MG PO TBDP
8 MG | ORAL_TABLET | Freq: Three times a day (TID) | ORAL | 0 refills | Status: AC | PRN
Start: 2021-11-15 — End: 2021-11-22

## 2021-11-15 MED ORDER — CEPHALEXIN 500 MG PO CAPS
500 MG | ORAL_CAPSULE | Freq: Three times a day (TID) | ORAL | 0 refills | Status: AC
Start: 2021-11-15 — End: 2021-11-18

## 2021-11-15 MED ORDER — OXYCODONE-ACETAMINOPHEN 5-325 MG PO TABS
5-325 MG | ORAL_TABLET | ORAL | 0 refills | Status: AC | PRN
Start: 2021-11-15 — End: 2021-11-23

## 2021-11-15 NOTE — Progress Notes (Signed)
Name: Travis Montgomery    DOB: [birth year]     [facility]  [place] [facility] [place] [facility]  [address], [place]  [place] [address]  Dept: [phone]  Dept Fax: [phone]     Chief Complaint   Patient presents with    Knee Pain    Pre-op Exam        There were no vitals taken for this visit.     Allergies   Allergen Reactions    Eszopiclone      Other reaction(s): ineffective  Other reaction(s): ineffective    Iodinated Contrast Media Other (See Comments)    Iohexol     Modafinil      Other reaction(s): Unknown  Other reaction(s): Unknown    Suvorexant      Other reaction(s): ineffective  Other reaction(s): ineffective    Temazepam      Other reaction(s): ineffective  Other reaction(s): ineffective    Trazodone Hcl      Other reaction(s): depression  Other reaction(s): depression    Zolpidem Other (See Comments)    Zolpidem Tartrate      Other reaction(s): forgetful    Armodafinil Nausea Only        Current Outpatient Medications   Medication Sig Dispense Refill    diazePAM (VALIUM) 5 MG tablet       gabapentin (NEURONTIN) 300 MG capsule       HYDROcodone-acetaminophen (NORCO) 5-325 MG per tablet       VYVANSE 70 MG capsule        No current facility-administered medications for this visit.      Patient Active Problem List   Diagnosis    Backache    Attention deficit disorder    Anxiety disorder    Adjustment disorder with mixed anxiety and depressed mood    Osteoarthritis of knee    Nephrolithiasis    Hyperlipidemia    Ganglion cyst    Insomnia    Thalassemia      Family History   Problem Relation Age of Onset    Cancer Mother       Social History     Socioeconomic History    Marital status: Married     Spouse name: None    Number of children: None    Years of education: None    Highest education level: None   Tobacco Use    Smoking status: Never    Smokeless tobacco: Never   Substance and Sexual Activity    Alcohol use: Yes      History reviewed. No  pertinent surgical history.   History reviewed. No pertinent past medical history.     I have reviewed and agree with PFSH and ROS and intake form in chart and the record furthermore I have reviewed prior medical record(s) regarding this patients care during this appointment.     Review of Systems:   Patient is a pleasant appearing individual, appropriately dressed, well hydrated, well nourished, who is alert, appropriately oriented for age, and in no acute distress with a normal gait and normal affect who does not appear to be in any significant pain.    Physical Exam:  Left Knee -Decrease range of motion with flexion, Knee arc of greater than 50 degrees, Some crepitation, Grossly neurovascularly intact, Good cap refill, No skin lesion, Moderate swelling, some gross instability, Some quadriceps weakness, Kellgren and Lawrence at least grade 3    Right Knee -  Full Range of Motion, No crepitation, Grossly neurovascularly intact, Good cap refill, No skin lesion, No swelling, No gross instability, No quadriceps weakness     Inpatient status: The patient has admitted to severe pain in the affected knee and due to such pain they are unable to complete activities of daily living at home and/or work on a regular basis where conservative treatments have failed. After extensive discussion with the patient, they have chosen to receive a total knee replacement with the expectation of inpatient procedure. Their dependent functional status (i.e. lack of capable support and safety at home, pain management, comorbities, or difficulty ambulating with assistive walking devices) would deem them a candidate for an inpatient stay. The patient acknowledges and understand the plan.    The risks of surgery were explained to the patient which include but not limited to infection, nerve injury, artery injury, tendon injury, poor result, poor wound healing, unforeseen incidence, bleeding, infection, nerve damage, failure to improve,  worsening of symptoms, morbidity, and mortality risks were explained. All questions were answered. Patient was told of no guarantees. Patient accepts all risks and benefits. A consent for surgery will be documented and signed by the patient or a legal guardian. All questions were answered. The procedure was explained in detail.     The patient was counseled about the risks of contracting Covid-19 during their perioperative period and any recovery window from their procedure. The patient was made aware that contracting Covid-19 may worsen their prognosis for recovering from their procedure and lend to a higher morbidity and/or mortality risk. All material risks, benefits, and reasonable alternatives including postponing the procedure were discussed. The patient DOES wish to proceed with their procedure at this time.           HPI:  The patient is here with a chief complaint of left knee pain, throbbing burning pain.  It is the same, nothing has helped.  Pain is 7/10.       Assessment/Plan:  Plan will be for left total knee replacement, Percocet, Keflex, Zofran, aspirin for DVT prophylaxis.  No history of blood clots, [facility], and go from there.        As part of continued conservative pain management options the patient was advised to utilize Tylenol or OTC NSAIDS as long as it is not medically contraindicated.     Return to Office:    Follow-up and Dispositions    Return for ALREADY SCHEDULED FOR SURGERY.        Scribed by Diona Foley, LPN as dictated by [facility] A. Allena Katz, MD.  Documentation, performed by, True and Accepted Gedalya Jim A. Allena Katz, MD

## 2021-11-15 NOTE — Patient Instructions (Signed)
Knee Arthritis: Care Instructions  Overview     Knee arthritis is a breakdown of the cartilage that cushions your knee joint. When the cartilage wears down, your bones rub against each other. This causes pain and stiffness. Knee arthritis tends to get worse with time.  Treatment for knee arthritis involves reducing pain, making the leg muscles stronger, and staying at a healthy body weight. The treatment usually does not improve the health of the cartilage, but it can reduce pain and improve how well your knee works.  You can take simple measures to protect your knee joints, ease your pain, and help you stay active.  Follow-up care is a key part of your treatment and safety. Be sure to make and go to all appointments, and call your doctor if you are having problems. It's also a good idea to know your test results and keep a list of the medicines you take.  How can you care for yourself at home?  Know that knee arthritis will cause more pain on some days than on others.  Stay at a healthy weight. Lose weight if you are overweight. When you stand up, the pressure on your knees from every pound of body weight is multiplied four times. So if you lose 10 pounds, you will reduce the pressure on your knees by 40 pounds.  Talk to your doctor or physical therapist about exercises that will help ease joint pain.  Stretch to help prevent stiffness and to prevent injury before you exercise. You may enjoy gentle forms of yoga to help keep your knee joints and muscles flexible.  Walk instead of jog.  Ride a bike. This makes your thigh muscles stronger and takes pressure off your knee.  Wear well-fitting and comfortable shoes.  Exercise in chest-deep water. This can help you exercise longer with less pain.  Avoid exercises that include squatting or kneeling. They can put a lot of strain on your knees.  Talk to your doctor to make sure that the exercise you do is not making the arthritis worse.  Do not sit for long periods of time.  Try to walk once in a while to keep your knee from getting stiff.  Ask your doctor or physical therapist whether shoe inserts may reduce your arthritis pain.  If you can afford it, get new athletic shoes at least every year. This can help reduce the strain on your knees.  Use a device to help you do everyday activities.  A cane or walking stick can help you keep your balance when you walk. Hold the cane or walking stick in the hand opposite the painful knee.  If you feel like you may fall when you walk, try using crutches or a front-wheeled walker. These can prevent falls that could cause more damage to your knee.  A knee brace may help keep your knee stable and prevent pain.  You also can use other things to make life easier, such as a higher toilet seat and handrails in the bathtub or shower.  Take pain medicines exactly as directed.  Do not wait until you are in severe pain. You will get better results if you take it sooner.  If you are not taking a prescription pain medicine, take an over-the-counter medicine such as acetaminophen (Tylenol), ibuprofen (Advil, Motrin), or naproxen (Aleve). Read and follow all instructions on the label.  Do not take two or more pain medicines at the same time unless the doctor told you to. Many pain   medicines have acetaminophen, which is Tylenol. Too much acetaminophen (Tylenol) can be harmful.  Tell your doctor if you take a blood thinner, have diabetes, or have allergies to shellfish.  Ask your doctor if you might benefit from a shot of steroid medicine into your knee. This may provide pain relief for several months.  Many people take the supplements glucosamine and chondroitin for osteoarthritis. Some people feel they help, but the medical research does not show that they work. Talk to your doctor before you take these supplements.  When should you call for help?   Call your doctor now or seek immediate medical care if:    You have sudden swelling, warmth, or pain in your knee.      You have knee pain and a fever or rash.     You have such bad pain that you cannot use your knee.   Watch closely for changes in your health, and be sure to contact your doctor if you have any problems.  Where can you learn more?  Go to https://www.healthwise.net/patientEd and enter W187 to learn more about "Knee Arthritis: Care Instructions."  Current as of: June 24, 2020               Content Version: 13.5   2006-2022 Healthwise, Inc[employer].   Care instructions adapted under license by [facility]. If you have questions about a medical condition or this instruction, always ask your healthcare professional. [employer], Inc[employer] disclaims any warranty or liability for your use of this information.

## 2021-11-16 ENCOUNTER — Encounter: Admit: 2021-11-16 | Discharge: 2021-11-16 | Payer: BLUE CROSS/BLUE SHIELD | Attending: Orthopaedic Surgery

## 2021-11-22 ENCOUNTER — Telehealth

## 2021-11-22 MED ORDER — OXYCODONE-ACETAMINOPHEN 5-325 MG PO TABS
5-325 MG | ORAL_TABLET | ORAL | 0 refills | Status: AC | PRN
Start: 2021-11-22 — End: 2021-11-30

## 2021-11-22 NOTE — Telephone Encounter (Signed)
LT TKR was 11/16/21.    Patient is currently taking 5-325 Percocet. He states he has been taking 2 to manage pain and is requesting a refill and to also up the dosage to 10MG  so he can only take 1 at a time.    Pharmacy: in [place], [place] [address]

## 2021-11-23 ENCOUNTER — Ambulatory Visit: Payer: BC Managed Care – PPO | Attending: Orthopedic Surgery

## 2021-11-23 DIAGNOSIS — Z96652 Presence of left artificial knee joint: Secondary | ICD-10-CM | POA: Diagnosis not present

## 2021-11-23 DIAGNOSIS — R293 Abnormal posture: Secondary | ICD-10-CM | POA: Diagnosis not present

## 2021-11-23 DIAGNOSIS — M6281 Muscle weakness (generalized): Secondary | ICD-10-CM

## 2021-11-23 DIAGNOSIS — R262 Difficulty in walking, not elsewhere classified: Secondary | ICD-10-CM | POA: Insufficient documentation

## 2021-11-23 DIAGNOSIS — R252 Cramp and spasm: Secondary | ICD-10-CM | POA: Insufficient documentation

## 2021-11-23 DIAGNOSIS — M25562 Pain in left knee: Secondary | ICD-10-CM | POA: Insufficient documentation

## 2021-11-23 NOTE — Therapy (Signed)
OUTPATIENT PHYSICAL THERAPY LOWER EXTREMITY EVALUATION   Patient Name: Bobby House MRN: [id number] DOB:07/24/1960, 61 y.o., male Today's Date: 11/24/2021   PT End of Session - 11/23/21 1415     Visit Number 1    Date for PT Re-Evaluation 01/18/22    Authorization Type BCBS    PT Start Time 1400    PT Stop Time 1445    PT Time Calculation (min) 45 min    Activity Tolerance Patient tolerated treatment well    Behavior During Therapy WFL for tasks assessed/performed             Past Medical History:  Diagnosis Date   ADD (attention deficit disorder)    DDD (degenerative disc disease), lumbar    Hypertension    Prostatitis    Umbilical hernia    Past Surgical History:  Procedure Laterality Date   LUMBAR DISC SURGERY     Patient Active Problem List   Diagnosis Date Noted   Ganglion cyst Left post lat knee 05/28/2021   Primary osteoarthritis of left knee 05/28/2021   Anxiety state, unspecified 01/09/2013   Attention deficit disorder without mention of hyperactivity 01/09/2013   Adjustment disorder with mixed anxiety and depressed mood 09/19/2012    PCP:   Deretha Emory, MD    REFERRING PROVIDER: Eulis Canner, MD   REFERRING DIAG: [phone] (ICD-10-CM) - Presence of left artificial knee joint   THERAPY DIAG:  Abnormal posture  Cramp and spasm  Difficulty in walking, not elsewhere classified  Muscle weakness (generalized)  Rationale for Evaluation and Treatment Rehabilitation  ONSET DATE: 11/16/21  SUBJECTIVE:   SUBJECTIVE STATEMENT: 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  Had left TKA on 11/16/21 using "jiffy" knee procedure.  He states he will be having right knee done in October.  He states his pain is fairly controlled but experiences the post rest stiffness and difficulty with start up.  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  He is sleeping at night if he lies  on his side.  He is working to try and avoid extension issues.    PERTINENT HISTORY: na  PAIN:  Are you having pain? Yes: NPRS scale: 5/10 Pain location: left knee Pain description: dull ache Aggravating factors: post rest Relieving factors: ice, movement  PRECAUTIONS: None  WEIGHT BEARING RESTRICTIONS No  FALLS:  Has patient fallen in last 6 months? No  LIVING ENVIRONMENT: Lives with: lives with their spouse Lives in: House/apartment Stairs: Yes: Internal: 12 steps; can reach both and External: 4 steps; can reach both Has following equipment at home: None  OCCUPATION: pharmacy  PLOF: Independent  PATIENT GOALS to be able to resume walking and hiking   OBJECTIVE:   DIAGNOSTIC FINDINGS: na  PATIENT SURVEYS:  FOTO 16 (goal is 72)  COGNITION:  Overall cognitive status: Within functional limits for tasks assessed     SENSATION: WFL  POSTURE:  left LE with pronounced knee varus   LOWER EXTREMITY ROM:  Active ROM Right eval Left eval  Knee flexion WNL 90  Knee extension WNL 0   (Blank rows = not tested)  LOWER EXTREMITY MMT:  Right LE generally 4+/5   Left knee not tested due to early post op.     FUNCTIONAL TESTS:  5 times sit to stand: 12.04 Timed up and go (TUG): 10.48  GAIT: Distance walked: 50 ft Assistive device utilized: None Level of  assistance: Complete Independence Comments: antalgic    TODAY'S TREATMENT: Initial eval completed and initiated HEP   PATIENT EDUCATION:  Education details: Initiated HEP Person educated: Patient Education method: Explanation, Demonstration, Verbal cues, and Handouts Education comprehension: verbalized understanding, returned demonstration, and verbal cues required   HOME EXERCISE PROGRAM: Access Code: [email or web address] URL: [email or web address] Date: 11/23/2021 Prepared by: Mikey Kirschner  Exercises - Supine Knee Extension Mobilization with Weight  - 2 x daily - 7 x weekly - 1 sets - 1  reps - Supine Ankle Pumps  - 2 x daily - 7 x weekly - 1 sets - 30 reps - Supine Quadricep Sets  - 2 x daily - 7 x weekly - 1 sets - 20 reps - Supine Heel Slide  - 2 x daily - 7 x weekly - 3 sets - 10 reps - Supine Hip Abduction  - 2 x daily - 7 x weekly - 3 sets - 10 reps - Small Range Straight Leg Raise  - 1 x daily - 7 x weekly - 3 sets - 10 reps - Seated Long Arc Quad  - 1 x daily - 7 x weekly - 3 sets - 10 reps  ASSESSMENT:  CLINICAL IMPRESSION: Patient is a 61 y.o. male who was seen today for physical therapy evaluation and treatment for Left TKA using jiffy knee procedure.  He presents with decreased ROM, strength and function along with elevated pain.  He is ambulating with slightly antalgic gait but no assistive device.  He states that when his pain level is up, he uses a cane to avoid excessive antalgic gait and compensation.  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  He hopes to return to this job soon but also has right TKA scheduled in October.  His goals are to be able to walk and hike without pain.     OBJECTIVE IMPAIRMENTS Abnormal gait, difficulty walking, decreased ROM, decreased strength, increased muscle spasms, and pain.   ACTIVITY LIMITATIONS carrying, lifting, bending, sitting, standing, squatting, sleeping, stairs, transfers, and dressing  PARTICIPATION LIMITATIONS: meal prep, cleaning, laundry, driving, shopping, community activity, occupation, and yard work  PERSONAL FACTORS Fitness, Past/current experiences, and Profession are also affecting patient's functional outcome.   REHAB POTENTIAL: Excellent  CLINICAL DECISION MAKING: Stable/uncomplicated  EVALUATION COMPLEXITY: Low   GOALS: Goals reviewed with patient? Yes  SHORT TERM GOALS: Target date: 12/22/2021  Patient will be independent with initial HEP  Baseline: Goal status: INITIAL  2.  Pain report to be no greater than 4/10  Baseline:  Goal status: INITIAL  3.  Left knee flexion AROM to  100 degrees Baseline: 90 Goal status: INITIAL   LONG TERM GOALS: Target date: 01/19/2022   Patient to be independent with advanced HEP  Baseline:  Goal status: INITIAL  2.  Patient to report pain no greater than 2/10  Baseline:  Goal status: INITIAL  3.  Left knee flexion and extension to be within 5 degrees of uninvolved knee Baseline:  Goal status: INITIAL  4.  MMT to be 5/5 on left knee flexion and extension Baseline:  Goal status: INITIAL  5.  Patient to be able to ascend and descend steps with reciprocal gait without compensation Baseline:  Goal status: INITIAL  6.  Normalized gait with good heel to toe progression Baseline:  Goal status: INITIAL   PLAN: PT FREQUENCY: 3x/week  PT DURATION: 6 weeks  PLANNED INTERVENTIONS: Therapeutic exercises, Therapeutic activity, Neuromuscular re-education, Balance training, Gait training, Patient/Family  education, Self Care, Joint mobilization, Stair training, DME instructions, Aquatic Therapy, Dry Needling, Electrical stimulation, Cryotherapy, Moist heat, Compression bandaging, Taping, Vasopneumatic device, Manual therapy, and Re-evaluation  PLAN FOR NEXT SESSION: Review HEP, recumbent bike or Nustep for ROM,  progress quad rehab and functional strengthening   Annalysia Willenbring B. Quartez [place], PT 11/24/21 12:15 PM  [facility] [address] [place], [place] [address] Phone # [phone] Fax [phone]

## 2021-11-24 ENCOUNTER — Ambulatory Visit: Payer: BC Managed Care – PPO

## 2021-11-24 ENCOUNTER — Other Ambulatory Visit: Payer: Self-pay

## 2021-11-24 DIAGNOSIS — R262 Difficulty in walking, not elsewhere classified: Secondary | ICD-10-CM

## 2021-11-24 DIAGNOSIS — M25562 Pain in left knee: Secondary | ICD-10-CM | POA: Diagnosis not present

## 2021-11-24 DIAGNOSIS — R293 Abnormal posture: Secondary | ICD-10-CM

## 2021-11-24 DIAGNOSIS — M6281 Muscle weakness (generalized): Secondary | ICD-10-CM

## 2021-11-24 DIAGNOSIS — R252 Cramp and spasm: Secondary | ICD-10-CM

## 2021-11-24 NOTE — Therapy (Signed)
OUTPATIENT PHYSICAL THERAPY TREATMENT NOTE   Patient Name: Bobby House MRN: [id number] DOB:03-15-1961, 61 y.o., male Today's Date: 11/24/2021  PCP: Deretha Emory, MD REFERRING PROVIDER: Eulis Canner, MD  END OF SESSION:   PT End of Session - 11/24/21 1509     Visit Number 2    Date for PT Re-Evaluation 01/18/22    Authorization Type BCBS    PT Start Time 1445    PT Stop Time 1530    PT Time Calculation (min) 45 min    Activity Tolerance Patient tolerated treatment well    Behavior During Therapy WFL for tasks assessed/performed             Past Medical History:  Diagnosis Date   ADD (attention deficit disorder)    DDD (degenerative disc disease), lumbar    Hypertension    Prostatitis    Umbilical hernia    Past Surgical History:  Procedure Laterality Date   LUMBAR DISC SURGERY     Patient Active Problem List   Diagnosis Date Noted   Ganglion cyst Left post lat knee 05/28/2021   Primary osteoarthritis of left knee 05/28/2021   Anxiety state, unspecified 01/09/2013   Attention deficit disorder without mention of hyperactivity 01/09/2013   Adjustment disorder with mixed anxiety and depressed mood 09/19/2012    REFERRING DIAG: V89.381 (ICD-10-CM) - Presence of left artificial knee joint   THERAPY DIAG:  Abnormal posture  Cramp and spasm  Difficulty in walking, not elsewhere classified  Muscle weakness (generalized)  Rationale for Evaluation and Treatment Rehabilitation  PERTINENT HISTORY: hx lumbar fusion  PRECAUTIONS: none  SUBJECTIVE: Patient states he is still having difficulty controlling his pain.  He is still at around 5-6 with the medication.  He is concerned that the meds are not controlling the  pain well.  He admits he does not use the ice as much as he could and he may try this.    PAIN:  Are you having pain? Yes: NPRS scale: 5/10 Pain location: left knee Pain description: aching Aggravating factors: immobility Relieving  factors: meds, movement   OBJECTIVE: (objective measures completed at initial evaluation unless otherwise dated)   OBJECTIVE:    DIAGNOSTIC FINDINGS: na   PATIENT SURVEYS:  FOTO 67 (goal is 73)   COGNITION:           Overall cognitive status: Within functional limits for tasks assessed                          SENSATION: WFL   POSTURE:  left LE with pronounced knee varus     LOWER EXTREMITY ROM:   Active ROM Right eval Left eval  Knee flexion WNL 90  Knee extension WNL 0   (Blank rows = not tested)   LOWER EXTREMITY MMT:            Right LE generally 4+/5   Left knee not tested due to early post op.       FUNCTIONAL TESTS:  5 times sit to stand: 12.04 Timed up and go (TUG): 10.48   GAIT: Distance walked: 50 ft Assistive device utilized: None Level of assistance: Complete Independence Comments: antalgic       TODAY'S TREATMENT: 11/24/21 Nustep x 7 min (PT present to discuss pain control and progress) Supine quad sets x 20 Supine TKE on small green noodle x 20 Supine SAQ over white foam roller x 20 Heel slides x 20 SLR x  20 with heavy vc's for avoiding heel lag Supine hip abduction x 20 Seated LAQ  x20 Discussed pain control and appropriate use of ice along with meds to efficiently control pain Sit to stand 2 x 10 with vc's for appropriate weight shift Suggested ice as soon as he arrives home  TODAY'S TREATMENT: 11/23/21 Initial eval completed and initiated HEP     PATIENT EDUCATION:  Education details: Initiated HEP Person educated: Patient Education method: Programmer, multimedia, Facilities manager, Verbal cues, and Handouts Education comprehension: verbalized understanding, returned demonstration, and verbal cues required     HOME EXERCISE PROGRAM: Access Code: [email or web address] URL: [email or web address] Date: 11/23/2021 Prepared by: Mikey Kirschner   Exercises - Supine Knee Extension Mobilization with Weight  - 2 x daily - 7 x weekly - 1 sets - 1  reps - Supine Ankle Pumps  - 2 x daily - 7 x weekly - 1 sets - 30 reps - Supine Quadricep Sets  - 2 x daily - 7 x weekly - 1 sets - 20 reps - Supine Heel Slide  - 2 x daily - 7 x weekly - 3 sets - 10 reps - Supine Hip Abduction  - 2 x daily - 7 x weekly - 3 sets - 10 reps - Small Range Straight Leg Raise  - 1 x daily - 7 x weekly - 3 sets - 10 reps - Seated Long Arc Quad  - 1 x daily - 7 x weekly - 3 sets - 10 reps   ASSESSMENT:   CLINICAL IMPRESSION: Bobby House arrives without cane, good heel to toe progression and improving weight shift and equal weight bearing.  He is very diligent with his HEP.  He is struggling to achieve quad activity at end range but is doing very well with ROM including flexion and extension.  He is well motivated and compliant.  He would benefit from continued skilled PT for post TKA protocol.       OBJECTIVE IMPAIRMENTS Abnormal gait, difficulty walking, decreased ROM, decreased strength, increased muscle spasms, and pain.    ACTIVITY LIMITATIONS carrying, lifting, bending, sitting, standing, squatting, sleeping, stairs, transfers, and dressing   PARTICIPATION LIMITATIONS: meal prep, cleaning, laundry, driving, shopping, community activity, occupation, and yard work   PERSONAL FACTORS Fitness, Past/current experiences, and Profession are also affecting patient's functional outcome.    REHAB POTENTIAL: Excellent   CLINICAL DECISION MAKING: Stable/uncomplicated   EVALUATION COMPLEXITY: Low     GOALS: Goals reviewed with patient? Yes   SHORT TERM GOALS: Target date: 12/22/2021  Patient will be independent with initial HEP  Baseline: Goal status: INITIAL   2.  Pain report to be no greater than 4/10  Baseline:  Goal status: INITIAL   3.  Left knee flexion AROM to 100 degrees Baseline: 90 Goal status: INITIAL     LONG TERM GOALS: Target date: 01/19/2022    Patient to be independent with advanced HEP  Baseline:  Goal status: INITIAL   2.  Patient to  report pain no greater than 2/10  Baseline:  Goal status: INITIAL   3.  Left knee flexion and extension to be within 5 degrees of uninvolved knee Baseline:  Goal status: INITIAL   4.  MMT to be 5/5 on left knee flexion and extension Baseline:  Goal status: INITIAL   5.  Patient to be able to ascend and descend steps with reciprocal gait without compensation Baseline:  Goal status: INITIAL   6.  Normalized gait with good heel to toe progression Baseline:  Goal status: INITIAL     PLAN: PT FREQUENCY: 3x/week   PT DURATION: 6 weeks   PLANNED INTERVENTIONS: Therapeutic exercises, Therapeutic activity, Neuromuscular re-education, Balance training, Gait training, Patient/Family education, Self Care, Joint mobilization, Stair training, DME instructions, Aquatic Therapy, Dry Needling, Electrical stimulation, Cryotherapy, Moist heat, Compression bandaging, Taping, Vasopneumatic device, Manual therapy, and Re-evaluation   PLAN FOR NEXT SESSION: Recumbent bike or Nustep for ROM,  progress quad rehab and functional strengthening     Neva Ramaswamy B. Oluwatobi Ruppe, PT 11/24/21 5:42 PM  [facility] [address], [address] [place], [place] [address] Phone # [phone] Fax [phone]

## 2021-11-25 ENCOUNTER — Encounter: Payer: BLUE CROSS/BLUE SHIELD | Attending: Orthopaedic Surgery

## 2021-11-25 NOTE — Therapy (Signed)
OUTPATIENT PHYSICAL THERAPY TREATMENT NOTE   Patient Name: Bobby House MRN: [id number] DOB:11-29-1960, 61 y.o., male Today's Date: 11/26/2021  PCP: Deretha Emory, MD REFERRING PROVIDER: Eulis Canner, MD  END OF SESSION:   PT End of Session - 11/26/21 1151     Visit Number 3    Date for PT Re-Evaluation 01/18/22    Authorization Type BCBS    PT Start Time 1100    PT Stop Time 1149    PT Time Calculation (min) 49 min    Activity Tolerance Patient tolerated treatment well    Behavior During Therapy WFL for tasks assessed/performed              Past Medical History:  Diagnosis Date   ADD (attention deficit disorder)    DDD (degenerative disc disease), lumbar    Hypertension    Prostatitis    Umbilical hernia    Past Surgical History:  Procedure Laterality Date   LUMBAR DISC SURGERY     Patient Active Problem List   Diagnosis Date Noted   Ganglion cyst Left post lat knee 05/28/2021   Primary osteoarthritis of left knee 05/28/2021   Anxiety state, unspecified 01/09/2013   Attention deficit disorder without mention of hyperactivity 01/09/2013   Adjustment disorder with mixed anxiety and depressed mood 09/19/2012    REFERRING DIAG: C16.606 (ICD-10-CM) - Presence of left artificial knee joint   THERAPY DIAG:  Cramp and spasm  Abnormal posture  Difficulty in walking, not elsewhere classified  Muscle weakness (generalized)  Rationale for Evaluation and Treatment Rehabilitation  PERTINENT HISTORY: hx lumbar fusion  PRECAUTIONS: none  SUBJECTIVE: Patient states that he has been very active and has been compliant with using ice more frequently.   PAIN:  Are you having pain? Yes: NPRS scale: 5/10 Pain location: left knee Pain description: aching Aggravating factors: immobility Relieving factors: meds, movement   OBJECTIVE: (objective measures completed at initial evaluation unless otherwise dated)   OBJECTIVE:    DIAGNOSTIC FINDINGS: na    PATIENT SURVEYS:  FOTO 17 (goal is 23)   COGNITION:           Overall cognitive status: Within functional limits for tasks assessed                          SENSATION: WFL   POSTURE:  left LE with pronounced knee varus     LOWER EXTREMITY ROM:   Active ROM Right eval Left eval  Knee flexion WNL 90  Knee extension WNL 0   (Blank rows = not tested)   LOWER EXTREMITY MMT:            Right LE generally 4+/5   Left knee not tested due to early post op.       FUNCTIONAL TESTS:  5 times sit to stand: 12.04 Timed up and go (TUG): 10.48   GAIT: Distance walked: 50 ft Assistive device utilized: None Level of assistance: Complete Independence Comments: antalgic   TODAY'S TREATMENT: 11/26/21 There ex:  Nustep x 7 min (PT present to discuss pain control and progress) Heel slides with strap to pt tolerance - in supine x 20  Neuro:  Supine quad sets x 20 Supine TKE on small green noodle x 20 Supine SAQ over white foam roller x 20 SLR x 20 with heavy vc's for avoiding heel lag Seated LAQ  x20 Manual:   Short sitting distraction  OP into flexion with distraction to pt  tolerance. - Reaching 95 degrees.   Game ready:  10 min Low compression   TODAY'S TREATMENT: 11/24/21 Nustep x 7 min (PT present to discuss pain control and progress) Supine quad sets x 20 Supine TKE on small green noodle x 20 Supine SAQ over white foam roller x 20 Heel slides x 20 SLR x 20 with heavy vc's for avoiding heel lag Supine hip abduction x 20 Seated LAQ  x20 Discussed pain control and appropriate use of ice along with meds to efficiently control pain Sit to stand 2 x 10 with vc's for appropriate weight shift Suggested ice as soon as he arrives home  TODAY'S TREATMENT: 11/23/21 Initial eval completed and initiated HEP     PATIENT EDUCATION:  Education details: Initiated HEP Person educated: Patient Education method: Programmer, multimedia, Facilities manager, Verbal cues, and Handouts Education  comprehension: verbalized understanding, returned demonstration, and verbal cues required     HOME EXERCISE PROGRAM: Access Code: [email or web address] URL: [email or web address] Date: 11/23/2021 Prepared by: Mikey Kirschner   Exercises - Supine Knee Extension Mobilization with Weight  - 2 x daily - 7 x weekly - 1 sets - 1 reps - Supine Ankle Pumps  - 2 x daily - 7 x weekly - 1 sets - 30 reps - Supine Quadricep Sets  - 2 x daily - 7 x weekly - 1 sets - 20 reps - Supine Heel Slide  - 2 x daily - 7 x weekly - 3 sets - 10 reps - Supine Hip Abduction  - 2 x daily - 7 x weekly - 3 sets - 10 reps - Small Range Straight Leg Raise  - 1 x daily - 7 x weekly - 3 sets - 10 reps - Seated Long Arc Quad  - 1 x daily - 7 x weekly - 3 sets - 10 reps   ASSESSMENT:   CLINICAL IMPRESSION: Cosmo arrives without cane, with good heel to toe progression and weight shifting.  He is very diligent with his HEP. Pt tolerating all exercises today with focus on quad activation. Manual therapy with OP into flexion reaching 95 degrees. Pt demonstrates good extension, but has significant swelling. Ended session with game ready with good response from pt. He is well motivated and compliant.  He would benefit from continued skilled PT for post TKA protocol.       OBJECTIVE IMPAIRMENTS Abnormal gait, difficulty walking, decreased ROM, decreased strength, increased muscle spasms, and pain.    ACTIVITY LIMITATIONS carrying, lifting, bending, sitting, standing, squatting, sleeping, stairs, transfers, and dressing   PARTICIPATION LIMITATIONS: meal prep, cleaning, laundry, driving, shopping, community activity, occupation, and yard work   PERSONAL FACTORS Fitness, Past/current experiences, and Profession are also affecting patient's functional outcome.    REHAB POTENTIAL: Excellent   CLINICAL DECISION MAKING: Stable/uncomplicated   EVALUATION COMPLEXITY: Low     GOALS: Goals reviewed with patient? Yes   SHORT  TERM GOALS: Target date: 12/22/2021  Patient will be independent with initial HEP  Baseline: Goal status: INITIAL   2.  Pain report to be no greater than 4/10  Baseline:  Goal status: INITIAL   3.  Left knee flexion AROM to 100 degrees Baseline: 90 Goal status: INITIAL     LONG TERM GOALS: Target date: 01/19/2022    Patient to be independent with advanced HEP  Baseline:  Goal status: INITIAL   2.  Patient to report pain no greater than 2/10  Baseline:  Goal status: INITIAL   3.  Left knee flexion and extension  to be within 5 degrees of uninvolved knee Baseline:  Goal status: INITIAL   4.  MMT to be 5/5 on left knee flexion and extension Baseline:  Goal status: INITIAL   5.  Patient to be able to ascend and descend steps with reciprocal gait without compensation Baseline:  Goal status: INITIAL   6.  Normalized gait with good heel to toe progression Baseline:  Goal status: INITIAL     PLAN: PT FREQUENCY: 3x/week   PT DURATION: 6 weeks   PLANNED INTERVENTIONS: Therapeutic exercises, Therapeutic activity, Neuromuscular re-education, Balance training, Gait training, Patient/Family education, Self Care, Joint mobilization, Stair training, DME instructions, Aquatic Therapy, Dry Needling, Electrical stimulation, Cryotherapy, Moist heat, Compression bandaging, Taping, Vasopneumatic device, Manual therapy, and Re-evaluation   PLAN FOR NEXT SESSION: Recumbent bike or Nustep for ROM,  progress quad rehab and functional strengthening     Rudi Heap PT, DPT 11/26/2021 [facility] [address], [place], [place] [address] Phone # [phone] Fax [phone]

## 2021-11-26 ENCOUNTER — Encounter: Payer: Self-pay | Admitting: Physical Therapy

## 2021-11-26 ENCOUNTER — Ambulatory Visit: Payer: BC Managed Care – PPO | Admitting: Physical Therapy

## 2021-11-26 DIAGNOSIS — R262 Difficulty in walking, not elsewhere classified: Secondary | ICD-10-CM

## 2021-11-26 DIAGNOSIS — R252 Cramp and spasm: Secondary | ICD-10-CM

## 2021-11-26 DIAGNOSIS — R293 Abnormal posture: Secondary | ICD-10-CM

## 2021-11-26 DIAGNOSIS — M25562 Pain in left knee: Secondary | ICD-10-CM | POA: Diagnosis not present

## 2021-11-26 DIAGNOSIS — M6281 Muscle weakness (generalized): Secondary | ICD-10-CM

## 2021-11-29 ENCOUNTER — Ambulatory Visit: Payer: BC Managed Care – PPO | Admitting: Physical Therapy

## 2021-11-29 ENCOUNTER — Encounter: Payer: Self-pay | Admitting: Physical Therapy

## 2021-11-29 ENCOUNTER — Telehealth

## 2021-11-29 DIAGNOSIS — R293 Abnormal posture: Secondary | ICD-10-CM

## 2021-11-29 DIAGNOSIS — M25562 Pain in left knee: Secondary | ICD-10-CM | POA: Diagnosis not present

## 2021-11-29 DIAGNOSIS — R252 Cramp and spasm: Secondary | ICD-10-CM

## 2021-11-29 DIAGNOSIS — R262 Difficulty in walking, not elsewhere classified: Secondary | ICD-10-CM

## 2021-11-29 DIAGNOSIS — M6281 Muscle weakness (generalized): Secondary | ICD-10-CM

## 2021-11-29 MED ORDER — OXYCODONE-ACETAMINOPHEN 5-325 MG PO TABS
5-325 MG | ORAL_TABLET | ORAL | 0 refills | Status: DC | PRN
Start: 2021-11-29 — End: 2021-12-06

## 2021-11-29 NOTE — Therapy (Signed)
OUTPATIENT PHYSICAL THERAPY TREATMENT NOTE   Patient Name: Bobby House MRN: [id number] DOB:14-Jan-1961, 61 y.o., male Today's Date: 11/29/2021  PCP: Marius Ditch, MD REFERRING PROVIDER: Derry Skill, MD  END OF SESSION:   PT End of Session - 11/29/21 1025     Visit Number 4    Date for PT Re-Evaluation 01/18/22    Authorization Type BCBS    PT Start Time 1022    Activity Tolerance Patient tolerated treatment well    Behavior During Therapy [facility] for tasks assessed/performed               Past Medical History:  Diagnosis Date   ADD (attention deficit disorder)    DDD (degenerative disc disease), lumbar    Hypertension    Prostatitis    Umbilical hernia    Past Surgical History:  Procedure Laterality Date   LUMBAR DISC SURGERY     Patient Active Problem List   Diagnosis Date Noted   Ganglion cyst Left post lat knee 05/28/2021   Primary osteoarthritis of left knee 05/28/2021   Anxiety state, unspecified 01/09/2013   Attention deficit disorder without mention of hyperactivity 01/09/2013   Adjustment disorder with mixed anxiety and depressed mood 09/19/2012    REFERRING DIAG: K59.935 (ICD-10-CM) - Presence of left artificial knee joint   THERAPY DIAG:  Cramp and spasm  Abnormal posture  Difficulty in walking, not elsewhere classified  Muscle weakness (generalized)  Rationale for Evaluation and Treatment Rehabilitation  PERTINENT HISTORY: hx lumbar fusion  PRECAUTIONS: none  SUBJECTIVE: Just a little pain but doing well.    PAIN:  Are you having pain? Yes: NPRS scale: 3/10 Pain location: left knee Pain description: aching Aggravating factors: immobility Relieving factors: meds, movement   OBJECTIVE: (objective measures completed at initial evaluation unless otherwise dated)   OBJECTIVE:    DIAGNOSTIC FINDINGS: na   PATIENT SURVEYS:  FOTO 53 (goal is 72)   COGNITION:           Overall cognitive status: Within functional limits  for tasks assessed                          SENSATION: WFL   POSTURE:  left LE with pronounced knee varus     LOWER EXTREMITY ROM:   Active ROM Right eval Left eval Left 11/29/21  Knee flexion WNL 90 105  Knee extension WNL 0    (Blank rows = not tested)   LOWER EXTREMITY MMT:            Right LE generally 4+/5   Left knee not tested due to early post op.       FUNCTIONAL TESTS:  5 times sit to stand: 12.04 Timed up and go (TUG): 10.48   GAIT: Distance walked: 50 ft Assistive device utilized: None Level of assistance: Complete Independence Comments: antalgic   TODAY'S TREATMENT:   11/29/21: Nustep L3 x 10 min with PTA present to discuss.  Seated heel slides 20x with Pt sitting on 1 blue pad.  Seated ball squeeze 5 sec hold 10x LAQ 2.5# 2x10 Seated yellow loop clamshell 15x Single leg stance LT with toe taps on first step with RTLE 2x10 Forward step ups light UE only 6' 10x Game Ready 15 min 3 flakes medium hooklying   11/26/21 There ex:  Nustep x 7 min (PT present to discuss pain control and progress) Heel slides with strap to pt tolerance - in supine x 20  Neuro:  Supine quad sets x 20 Supine TKE on small green noodle x 20 Supine SAQ over white foam roller x 20 SLR x 20 with heavy vc's for avoiding heel lag Seated LAQ  x20 Manual:   Short sitting distraction  OP into flexion with distraction to pt tolerance. - Reaching 95 degrees.   Game ready:  10 min Low compression   TODAY'S TREATMENT: 11/24/21 Nustep x 7 min (PT present to discuss pain control and progress) Supine quad sets x 20 Supine TKE on small green noodle x 20 Supine SAQ over white foam roller x 20 Heel slides x 20 SLR x 20 with heavy vc's for avoiding heel lag Supine hip abduction x 20 Seated LAQ  x20 Discussed pain control and appropriate use of ice along with meds to efficiently control pain Sit to stand 2 x 10 with vc's for appropriate weight shift Suggested ice as soon as he arrives  home  TODAY'S TREATMENT: 11/23/21 Initial eval completed and initiated HEP     PATIENT EDUCATION:  Education details: Initiated HEP Person educated: Patient Education method: Consulting civil engineer, Media planner, Verbal cues, and Handouts Education comprehension: verbalized understanding, returned demonstration, and verbal cues required     HOME EXERCISE PROGRAM: Access Code: [email or web address] URL: [email or web address] Date: 11/23/2021 Prepared by: Candyce Churn   Exercises - Supine Knee Extension Mobilization with Weight  - 2 x daily - 7 x weekly - 1 sets - 1 reps - Supine Ankle Pumps  - 2 x daily - 7 x weekly - 1 sets - 30 reps - Supine Quadricep Sets  - 2 x daily - 7 x weekly - 1 sets - 20 reps - Supine Heel Slide  - 2 x daily - 7 x weekly - 3 sets - 10 reps - Supine Hip Abduction  - 2 x daily - 7 x weekly - 3 sets - 10 reps - Small Range Straight Leg Raise  - 1 x daily - 7 x weekly - 3 sets - 10 reps - Seated Long Arc Quad  - 1 x daily - 7 x weekly - 3 sets - 10 reps   ASSESSMENT:   CLINICAL IMPRESSION: Pt walking his dog 20 min and doing steps "better" at home. Pt was able to flex knee to 105 today. Game ready helping reduce swelling.       OBJECTIVE IMPAIRMENTS Abnormal gait, difficulty walking, decreased ROM, decreased strength, increased muscle spasms, and pain.    ACTIVITY LIMITATIONS carrying, lifting, bending, sitting, standing, squatting, sleeping, stairs, transfers, and dressing   PARTICIPATION LIMITATIONS: meal prep, cleaning, laundry, driving, shopping, community activity, occupation, and yard work   PERSONAL FACTORS Fitness, Past/current experiences, and Profession are also affecting patient's functional outcome.    REHAB POTENTIAL: Excellent   CLINICAL DECISION MAKING: Stable/uncomplicated   EVALUATION COMPLEXITY: Low     GOALS: Goals reviewed with patient? Yes   SHORT TERM GOALS: Target date: 12/22/2021  Patient will be independent with initial HEP   Baseline: Goal status: Goal met 11/29/21  2.  Pain report to be no greater than 4/10  Baseline:  Goal status: Intermittent in range of pain.   3.  Left knee flexion AROM to 100 degrees Baseline: 90 Goal status: INITIAL     LONG TERM GOALS: Target date: 01/19/2022    Patient to be independent with advanced HEP  Baseline:  Goal status: INITIAL   2.  Patient to report pain no greater than 2/10  Baseline:  Goal status: INITIAL   3.  Left knee flexion and extension to be within 5 degrees of uninvolved knee Baseline:  Goal status: INITIAL   4.  MMT to be 5/5 on left knee flexion and extension Baseline:  Goal status: INITIAL   5.  Patient to be able to ascend and descend steps with reciprocal gait without compensation Baseline:  Goal status: INITIAL   6.  Normalized gait with good heel to toe progression Baseline:  Goal status: INITIAL     PLAN: PT FREQUENCY: 3x/week   PT DURATION: 6 weeks   PLANNED INTERVENTIONS: Therapeutic exercises, Therapeutic activity, Neuromuscular re-education, Balance training, Gait training, Patient/Family education, Self Care, Joint mobilization, Stair training, DME instructions, Aquatic Therapy, Dry Needling, Electrical stimulation, Cryotherapy, Moist heat, Compression bandaging, Taping, Vasopneumatic device, Manual therapy, and Re-evaluation   PLAN FOR NEXT SESSION: Recumbent bike or Nustep for ROM,  progress quad rehab and functional strengthening     Myrene Galas, PTA 11/29/21 10:30 AM

## 2021-11-29 NOTE — Telephone Encounter (Signed)
LT TKR 11/16/21.    Patient requesting refill of Percocet. Last refill was 11/22/21.    Ph[facility]: [facility] [place], [place] off [address]

## 2021-12-01 ENCOUNTER — Ambulatory Visit: Payer: BC Managed Care – PPO

## 2021-12-01 DIAGNOSIS — R262 Difficulty in walking, not elsewhere classified: Secondary | ICD-10-CM

## 2021-12-01 DIAGNOSIS — M25562 Pain in left knee: Secondary | ICD-10-CM | POA: Diagnosis not present

## 2021-12-01 DIAGNOSIS — M25662 Stiffness of left knee, not elsewhere classified: Secondary | ICD-10-CM

## 2021-12-01 DIAGNOSIS — M6281 Muscle weakness (generalized): Secondary | ICD-10-CM

## 2021-12-01 NOTE — Therapy (Signed)
OUTPATIENT PHYSICAL THERAPY TREATMENT NOTE   Patient Name: Bobby House MRN: [id number] DOB:07/06/1960, 61 y.o., male Today's Date: 12/01/2021  PCP: Bobby Ditch, MD REFERRING PROVIDER: Derry Skill, MD  END OF SESSION:   PT End of Session - 12/01/21 1028     Visit Number 5    Date for PT Re-Evaluation 01/18/22    Authorization Type BCBS    PT Start Time 1018    PT Stop Time 1100    PT Time Calculation (min) 42 min    Activity Tolerance Patient tolerated treatment well    Behavior During Therapy WFL for tasks assessed/performed               Past Medical History:  Diagnosis Date   ADD (attention deficit disorder)    DDD (degenerative disc disease), lumbar    Hypertension    Prostatitis    Umbilical hernia    Past Surgical History:  Procedure Laterality Date   LUMBAR DISC SURGERY     Patient Active Problem List   Diagnosis Date Noted   Ganglion cyst Left post lat knee 05/28/2021   Primary osteoarthritis of left knee 05/28/2021   Anxiety state, unspecified 01/09/2013   Attention deficit disorder without mention of hyperactivity 01/09/2013   Adjustment disorder with mixed anxiety and depressed mood 09/19/2012    REFERRING DIAG: O17.510 (ICD-10-CM) - Presence of left artificial knee joint   THERAPY DIAG:  Stiffness of left knee, not elsewhere classified  Acute pain of left knee  Muscle weakness (generalized)  Difficulty in walking, not elsewhere classified  Rationale for Evaluation and Treatment Rehabilitation  PERTINENT HISTORY: hx lumbar fusion  PRECAUTIONS: none  SUBJECTIVE: Not really aching any more but just stiff.  Doing good with HEP,  able to sit with it straight.      PAIN:  Are you having pain? Yes: NPRS scale: 3/10 Pain location: left knee Pain description: aching Aggravating factors: immobility Relieving factors: meds, movement   OBJECTIVE: (objective measures completed at initial evaluation unless otherwise  dated)   OBJECTIVE:    DIAGNOSTIC FINDINGS: na   PATIENT SURVEYS:  FOTO 53 (goal is 49)   COGNITION:           Overall cognitive status: Within functional limits for tasks assessed                          SENSATION: WFL   POSTURE:  left LE with pronounced knee varus     LOWER EXTREMITY ROM:   Active ROM Right eval Left eval Left 11/29/21  Knee flexion WNL 90 105  Knee extension WNL 0    (Blank rows = not tested)   LOWER EXTREMITY MMT:            Right LE generally 4+/5   Left knee not tested due to early post op.       FUNCTIONAL TESTS:  5 times sit to stand: 12.04 Timed up and go (TUG): 10.48   GAIT: Distance walked: 50 ft Assistive device utilized: None Level of assistance: Complete Independence Comments: antalgic   TODAY'S TREATMENT:    TODAY'S TREATMENT: 12/01/21 Nustep x 5 min (PT present to discuss pain control and progress) Supine quad sets x 20 Supine TKE on small green noodle x 20 Supine SAQ over white foam roller x 20 with 4 lb Supine SAQ over larger teal bolster x 20 with 4 lb SLR x 20 with vc's for terminal extension with  4 lb Heel slides x 10 active Left Heel slides x 10 active assistive with strap x 10 Supine hip abduction x 20 Seated LAQ  x 20  Sit to stand 2 x 10 with vc's for appropriate weight shift with 15 lb kb Game ready x 10 min to left knee in supine  TODAY'S TREATMENT: 11/29/21:  Nustep L3 x 10 min with PTA present to discuss.  Seated heel slides 20x with Pt sitting on 1 blue pad.  Seated ball squeeze 5 sec hold 10x LAQ 2.5# 2x10 Seated yellow loop clamshell 15x Single leg stance LT with toe taps on first step with RTLE 2x10 Forward step ups light UE only 6' 10x Game Ready 15 min 3 flakes medium hooklying   11/26/21 There ex:  Nustep x 7 min (PT present to discuss pain control and progress) Heel slides with strap to pt tolerance - in supine x 20  Neuro:  Supine quad sets x 20 Supine TKE on small green noodle x  20 Supine SAQ over white foam roller x 20 SLR x 20 with heavy vc's for avoiding heel lag Seated LAQ  x20 Manual:   Short sitting distraction  OP into flexion with distraction to pt tolerance. - Reaching 95 degrees.   Game ready:  10 min Low compression        PATIENT EDUCATION:  Education details: Initiated HEP Person educated: Patient Education method: Consulting civil engineer, Media planner, Verbal cues, and Handouts Education comprehension: verbalized understanding, returned demonstration, and verbal cues required     HOME EXERCISE PROGRAM: Access Code: [email or web address] URL: [email or web address] Date: 11/23/2021 Prepared by: Bobby House   Exercises - Supine Knee Extension Mobilization with Weight  - 2 x daily - 7 x weekly - 1 sets - 1 reps - Supine Ankle Pumps  - 2 x daily - 7 x weekly - 1 sets - 30 reps - Supine Quadricep Sets  - 2 x daily - 7 x weekly - 1 sets - 20 reps - Supine Heel Slide  - 2 x daily - 7 x weekly - 3 sets - 10 reps - Supine Hip Abduction  - 2 x daily - 7 x weekly - 3 sets - 10 reps - Small Range Straight Leg Raise  - 1 x daily - 7 x weekly - 3 sets - 10 reps - Seated Long Arc Quad  - 1 x daily - 7 x weekly - 3 sets - 10 reps   ASSESSMENT:   CLINICAL IMPRESSION: Bobby House is progressing appropriately.  He is very diligent with his HEP.  Good return of ROM in both flexion and extension.  He is beginning to establish good quad activity at end range but lacks solid quad set at terminal range.  He ambulates with good heel to toe progression with only slight weight shift deficit.  He would benefit from continued skilled PT for post TKA protocol.       OBJECTIVE IMPAIRMENTS Abnormal gait, difficulty walking, decreased ROM, decreased strength, increased muscle spasms, and pain.    ACTIVITY LIMITATIONS carrying, lifting, bending, sitting, standing, squatting, sleeping, stairs, transfers, and dressing   PARTICIPATION LIMITATIONS: meal prep, cleaning, laundry,  driving, shopping, community activity, occupation, and yard work   PERSONAL FACTORS Fitness, Past/current experiences, and Profession are also affecting patient's functional outcome.    REHAB POTENTIAL: Excellent   CLINICAL DECISION MAKING: Stable/uncomplicated   EVALUATION COMPLEXITY: Low     GOALS: Goals reviewed with patient? Yes   SHORT TERM GOALS: Target date: 12/22/2021  Patient will be independent with initial HEP  Baseline: Goal status: Goal met 11/29/21  2.  Pain report to be no greater than 4/10  Baseline:  Goal status: Intermittent in range of pain.   3.  Left knee flexion AROM to 100 degrees Baseline: 90 Goal status: INITIAL     LONG TERM GOALS: Target date: 01/19/2022    Patient to be independent with advanced HEP  Baseline:  Goal status: INITIAL   2.  Patient to report pain no greater than 2/10  Baseline:  Goal status: INITIAL   3.  Left knee flexion and extension to be within 5 degrees of uninvolved knee Baseline:  Goal status: INITIAL   4.  MMT to be 5/5 on left knee flexion and extension Baseline:  Goal status: INITIAL   5.  Patient to be able to ascend and descend steps with reciprocal gait without compensation Baseline:  Goal status: INITIAL   6.  Normalized gait with good heel to toe progression Baseline:  Goal status: INITIAL     PLAN: PT FREQUENCY: 3x/week   PT DURATION: 6 weeks   PLANNED INTERVENTIONS: Therapeutic exercises, Therapeutic activity, Neuromuscular re-education, Balance training, Gait training, Patient/Family education, Self Care, Joint mobilization, Stair training, DME instructions, Aquatic Therapy, Dry Needling, Electrical stimulation, Cryotherapy, Moist heat, Compression bandaging, Taping, Vasopneumatic device, Manual therapy, and Re-evaluation   PLAN FOR NEXT SESSION: Recumbent bike or Nustep for ROM,  progress quad rehab and functional strengthening     Mosi Hannold B. Atalaya Zappia, PT 12/01/21 11:03 AM  [facility] [address] [place], [place] [address] Phone # [phone] Fax [phone]

## 2021-12-03 ENCOUNTER — Encounter: Payer: Self-pay | Admitting: Physical Therapy

## 2021-12-03 ENCOUNTER — Ambulatory Visit: Payer: BC Managed Care – PPO | Admitting: Physical Therapy

## 2021-12-03 DIAGNOSIS — R293 Abnormal posture: Secondary | ICD-10-CM

## 2021-12-03 DIAGNOSIS — M6281 Muscle weakness (generalized): Secondary | ICD-10-CM

## 2021-12-03 DIAGNOSIS — M25662 Stiffness of left knee, not elsewhere classified: Secondary | ICD-10-CM

## 2021-12-03 DIAGNOSIS — M25562 Pain in left knee: Secondary | ICD-10-CM

## 2021-12-03 DIAGNOSIS — R262 Difficulty in walking, not elsewhere classified: Secondary | ICD-10-CM

## 2021-12-03 DIAGNOSIS — R252 Cramp and spasm: Secondary | ICD-10-CM

## 2021-12-03 NOTE — Therapy (Signed)
OUTPATIENT PHYSICAL THERAPY TREATMENT NOTE   Patient Name: Bobby House MRN: [id number] DOB:02-14-1961, 61 y.o., male Today's Date: 12/03/2021  PCP: Marius Ditch, MD REFERRING PROVIDER: Derry Skill, MD  END OF SESSION:   PT End of Session - 12/03/21 1021     Visit Number 6    Date for PT Re-Evaluation 01/18/22    Authorization Type BCBS    PT Start Time 1019    PT Stop Time 1110    PT Time Calculation (min) 51 min    Activity Tolerance Patient tolerated treatment well    Behavior During Therapy WFL for tasks assessed/performed               Past Medical History:  Diagnosis Date   ADD (attention deficit disorder)    DDD (degenerative disc disease), lumbar    Hypertension    Prostatitis    Umbilical hernia    Past Surgical History:  Procedure Laterality Date   LUMBAR DISC SURGERY     Patient Active Problem List   Diagnosis Date Noted   Ganglion cyst Left post lat knee 05/28/2021   Primary osteoarthritis of left knee 05/28/2021   Anxiety state, unspecified 01/09/2013   Attention deficit disorder without mention of hyperactivity 01/09/2013   Adjustment disorder with mixed anxiety and depressed mood 09/19/2012    REFERRING DIAG: I62.703 (ICD-10-CM) - Presence of left artificial knee joint   THERAPY DIAG:  Stiffness of left knee, not elsewhere classified  Acute pain of left knee  Muscle weakness (generalized)  Difficulty in walking, not elsewhere classified  Cramp and spasm  Abnormal posture  Rationale for Evaluation and Treatment Rehabilitation  PERTINENT HISTORY: hx lumbar fusion  PRECAUTIONS: none  SUBJECTIVE: Not really aching any more but just stiff.  Doing good with HEP,  able to sit with it straight.      PAIN:  Are you having pain? No just stiff   OBJECTIVE: (objective measures completed at initial evaluation unless otherwise dated)   OBJECTIVE:    DIAGNOSTIC FINDINGS: na   PATIENT SURVEYS:  FOTO 53 (goal is 72)    COGNITION:           Overall cognitive status: Within functional limits for tasks assessed                          SENSATION: WFL   POSTURE:  left LE with pronounced knee varus     LOWER EXTREMITY ROM:   Active ROM Right eval Left eval Left 11/29/21  Knee flexion WNL 90 105  Knee extension WNL 0    (Blank rows = not tested)   LOWER EXTREMITY MMT:            Right LE generally 4+/5   Left knee not tested due to early post op.       FUNCTIONAL TESTS:  5 times sit to stand: 12.04 Timed up and go (TUG): 10.48   GAIT: Distance walked: 50 ft Assistive device utilized: None Level of assistance: Complete Independence Comments: antalgic   TODAY'S TREATMENT:   12/03/21: Mini tramp weight shifting 1 min 3 ways Seat 9: LT only 50# 2x10 Vc to make sure he is contracting his quad. Step ups BOSU 10x Lateral step ups and down 2x10 Standing hams curls 3# 2x10 Sit to stand with 1 mat to sit on: 15# KB for upright row 2x10 Bike; 5 min ROM only full forward revolution. Seat all the way back.Then L1  x 5 min Game ready x 10 min to left knee in supine    TODAY'S TREATMENT: 12/01/21 Nustep x 5 min (PT present to discuss pain control and progress) Supine quad sets x 20 Supine TKE on small green noodle x 20 Supine SAQ over white foam roller x 20 with 4 lb Supine SAQ over larger teal bolster x 20 with 4 lb SLR x 20 with vc's for terminal extension with 4 lb Heel slides x 10 active Left Heel slides x 10 active assistive with strap x 10 Supine hip abduction x 20 Seated LAQ  x 20  Sit to stand 2 x 10 with vc's for appropriate weight shift with 15 lb kb Game ready x 10 min to left knee in supine  TODAY'S TREATMENT: 11/29/21:  Nustep L3 x 10 min with PTA present to discuss.  Seated heel slides 20x with Pt sitting on 1 blue pad.  Seated ball squeeze 5 sec hold 10x LAQ 2.5# 2x10 Seated yellow loop clamshell 15x Single leg stance LT with toe taps on first step with RTLE 2x10 Forward  step ups light UE only 6' 10x Game Ready 15 min 3 flakes medium hooklying      PATIENT EDUCATION:  Education details: Initiated HEP Person educated: Patient Education method: Consulting civil engineer, Media planner, Verbal cues, and Handouts Education comprehension: verbalized understanding, returned demonstration, and verbal cues required     HOME EXERCISE PROGRAM: Access Code: [email or web address] URL: [email or web address] Date: 11/23/2021 Prepared by: Candyce Churn   Exercises - Supine Knee Extension Mobilization with Weight  - 2 x daily - 7 x weekly - 1 sets - 1 reps - Supine Ankle Pumps  - 2 x daily - 7 x weekly - 1 sets - 30 reps - Supine Quadricep Sets  - 2 x daily - 7 x weekly - 1 sets - 20 reps - Supine Heel Slide  - 2 x daily - 7 x weekly - 3 sets - 10 reps - Supine Hip Abduction  - 2 x daily - 7 x weekly - 3 sets - 10 reps - Small Range Straight Leg Raise  - 1 x daily - 7 x weekly - 3 sets - 10 reps - Seated Long Arc Quad  - 1 x daily - 7 x weekly - 3 sets - 10 reps   ASSESSMENT:   CLINICAL IMPRESSION: Pt arrives with no pain, edema and discoloration appears to be reducing visually. Treatment focused on open and closed chair exercises to strengthen knee and improve his bend. Game Ready helping to reduce swelling.      OBJECTIVE IMPAIRMENTS Abnormal gait, difficulty walking, decreased ROM, decreased strength, increased muscle spasms, and pain.    ACTIVITY LIMITATIONS carrying, lifting, bending, sitting, standing, squatting, sleeping, stairs, transfers, and dressing   PARTICIPATION LIMITATIONS: meal prep, cleaning, laundry, driving, shopping, community activity, occupation, and yard work   PERSONAL FACTORS Fitness, Past/current experiences, and Profession are also affecting patient's functional outcome.    REHAB POTENTIAL: Excellent   CLINICAL DECISION MAKING: Stable/uncomplicated   EVALUATION COMPLEXITY: Low     GOALS: Goals reviewed with patient? Yes   SHORT TERM  GOALS: Target date: 12/22/2021  Patient will be independent with initial HEP  Baseline: Goal status: Goal met 11/29/21  2.  Pain report to be no greater than 4/10  Baseline:  Goal status: Intermittent in range of pain.   3.  Left knee flexion AROM to 100 degrees Baseline: 90 Goal status: Goal met 12/03/21     LONG TERM  GOALS: Target date: 01/19/2022    Patient to be independent with advanced HEP  Baseline:  Goal status: INITIAL   2.  Patient to report pain no greater than 2/10  Baseline:  Goal status: INITIAL   3.  Left knee flexion and extension to be within 5 degrees of uninvolved knee Baseline:  Goal status: INITIAL   4.  MMT to be 5/5 on left knee flexion and extension Baseline:  Goal status: INITIAL   5.  Patient to be able to ascend and descend steps with reciprocal gait without compensation Baseline:  Goal status: INITIAL   6.  Normalized gait with good heel to toe progression Baseline:  Goal status: INITIAL     PLAN: PT FREQUENCY: 3x/week   PT DURATION: 6 weeks   PLANNED INTERVENTIONS: Therapeutic exercises, Therapeutic activity, Neuromuscular re-education, Balance training, Gait training, Patient/Family education, Self Care, Joint mobilization, Stair training, DME instructions, Aquatic Therapy, Dry Needling, Electrical stimulation, Cryotherapy, Moist heat, Compression bandaging, Taping, Vasopneumatic device, Manual therapy, and Re-evaluation   PLAN FOR NEXT SESSION: Recumbent bike or Nustep for ROM,  progress quad rehab and functional strengthening      Myrene Galas, PTA 12/03/21 10:58 AM   [facility] [address], [place], [place] [address] Phone # [phone] Fax [phone]

## 2021-12-06 ENCOUNTER — Encounter: Payer: Self-pay | Admitting: Physical Therapy

## 2021-12-06 ENCOUNTER — Ambulatory Visit: Payer: BC Managed Care – PPO | Admitting: Physical Therapy

## 2021-12-06 ENCOUNTER — Telehealth

## 2021-12-06 DIAGNOSIS — R262 Difficulty in walking, not elsewhere classified: Secondary | ICD-10-CM

## 2021-12-06 DIAGNOSIS — M25662 Stiffness of left knee, not elsewhere classified: Secondary | ICD-10-CM

## 2021-12-06 DIAGNOSIS — M25562 Pain in left knee: Secondary | ICD-10-CM

## 2021-12-06 DIAGNOSIS — M6281 Muscle weakness (generalized): Secondary | ICD-10-CM

## 2021-12-06 MED ORDER — OXYCODONE-ACETAMINOPHEN 5-325 MG PO TABS
5-325 MG | ORAL_TABLET | ORAL | 0 refills | Status: AC | PRN
Start: 2021-12-06 — End: 2021-12-14

## 2021-12-06 NOTE — Therapy (Signed)
OUTPATIENT PHYSICAL THERAPY TREATMENT NOTE   Patient Name: Bobby House MRN: [id number] DOB:1960-11-20, 61 y.o., male Today's Date: 12/06/2021  PCP: Marius Ditch, MD REFERRING PROVIDER: Derry Skill, MD  END OF SESSION:   PT End of Session - 12/06/21 1024     Visit Number 7    Date for PT Re-Evaluation 01/18/22    Authorization Type BCBS    PT Start Time 1021    PT Stop Time 1115    PT Time Calculation (min) 54 min    Activity Tolerance Patient tolerated treatment well    Behavior During Therapy WFL for tasks assessed/performed               Past Medical History:  Diagnosis Date   ADD (attention deficit disorder)    DDD (degenerative disc disease), lumbar    Hypertension    Prostatitis    Umbilical hernia    Past Surgical History:  Procedure Laterality Date   LUMBAR DISC SURGERY     Patient Active Problem List   Diagnosis Date Noted   Ganglion cyst Left post lat knee 05/28/2021   Primary osteoarthritis of left knee 05/28/2021   Anxiety state, unspecified 01/09/2013   Attention deficit disorder without mention of hyperactivity 01/09/2013   Adjustment disorder with mixed anxiety and depressed mood 09/19/2012    REFERRING DIAG: K16.010 (ICD-10-CM) - Presence of left artificial knee joint   THERAPY DIAG:  Stiffness of left knee, not elsewhere classified  Acute pain of left knee  Muscle weakness (generalized)  Difficulty in walking, not elsewhere classified  Rationale for Evaluation and Treatment Rehabilitation  PERTINENT HISTORY: hx lumbar fusion  PRECAUTIONS: none  SUBJECTIVE: No issue with our sessions, medial knee stiffness only.    PAIN:  Are you having pain? No just stiff   OBJECTIVE: (objective measures completed at initial evaluation unless otherwise dated)   OBJECTIVE:    DIAGNOSTIC FINDINGS: na   PATIENT SURVEYS:  FOTO 53 (goal is 50)   COGNITION:           Overall cognitive status: Within functional limits for  tasks assessed                          SENSATION: WFL   POSTURE:  left LE with pronounced knee varus     LOWER EXTREMITY ROM:   Active ROM Right eval Left eval Left 11/29/21  Knee flexion WNL 90 105  Knee extension WNL 0    (Blank rows = not tested)   LOWER EXTREMITY MMT:            Right LE generally 4+/5   Left knee not tested due to early post op.       FUNCTIONAL TESTS:  5 times sit to stand: 12.04 Timed up and go (TUG): 10.48   GAIT: Distance walked: 50 ft Assistive device utilized: None Level of assistance: Complete Independence Comments: antalgic   TODAY'S TREATMENT:   12/06/21:  Cable walk: 10x each direction 20x Seat 9: LT only 50# 10x, 70# 10x2  Vc to make sure he is contracting his quad. Step ups BOSU 10x Standing hams curls 3# 2x10 Sit to stand with 1 mat to sit on: 15# KB for upright row 2x10 Bike; 2 min ROM only full forward revolution. Seat all the way back.Then L1 x 10 min Game ready x 10 min to left knee in supine    12/03/21: Mini tramp weight shifting 1 min 3  ways Seat 9: LT only 50# 2x10 Vc to make sure he is contracting his quad. Step ups BOSU 10x Lateral step ups and down 2x10 Standing hams curls 3# 2x10 Sit to stand with 1 mat to sit on: 15# KB for upright row 2x10 Bike; 5 min ROM only full forward revolution. Seat all the way back.Then L1 x 5 min Game ready x 10 min to left knee in supine    TODAY'S TREATMENT: 12/01/21 Nustep x 5 min (PT present to discuss pain control and progress) Supine quad sets x 20 Supine TKE on small green noodle x 20 Supine SAQ over white foam roller x 20 with 4 lb Supine SAQ over larger teal bolster x 20 with 4 lb SLR x 20 with vc's for terminal extension with 4 lb Heel slides x 10 active Left Heel slides x 10 active assistive with strap x 10 Supine hip abduction x 20 Seated LAQ  x 20  Sit to stand 2 x 10 with vc's for appropriate weight shift with 15 lb kb Game ready x 10 min to left knee in  supine  PATIENT EDUCATION:  Education details: Initiated HEP Person educated: Patient Education method: Consulting civil engineer, Media planner, Verbal cues, and Handouts Education comprehension: verbalized understanding, returned demonstration, and verbal cues required     HOME EXERCISE PROGRAM: Access Code: [email or web address] URL: [email or web address] Date: 11/23/2021 Prepared by: Candyce Churn   Exercises - Supine Knee Extension Mobilization with Weight  - 2 x daily - 7 x weekly - 1 sets - 1 reps - Supine Ankle Pumps  - 2 x daily - 7 x weekly - 1 sets - 30 reps - Supine Quadricep Sets  - 2 x daily - 7 x weekly - 1 sets - 20 reps - Supine Heel Slide  - 2 x daily - 7 x weekly - 3 sets - 10 reps - Supine Hip Abduction  - 2 x daily - 7 x weekly - 3 sets - 10 reps - Small Range Straight Leg Raise  - 1 x daily - 7 x weekly - 3 sets - 10 reps - Seated Long Arc Quad  - 1 x daily - 7 x weekly - 3 sets - 10 reps   ASSESSMENT:   CLINICAL IMPRESSION: Pt arrives with no pain, edema and discoloration appears to be reducing visually. Treatment focused on open and closed chair exercises to strengthen knee and improve his bend. Game Ready helping to reduce swelling. Pt's TKE showing improvement.      OBJECTIVE IMPAIRMENTS Abnormal gait, difficulty walking, decreased ROM, decreased strength, increased muscle spasms, and pain.    ACTIVITY LIMITATIONS carrying, lifting, bending, sitting, standing, squatting, sleeping, stairs, transfers, and dressing   PARTICIPATION LIMITATIONS: meal prep, cleaning, laundry, driving, shopping, community activity, occupation, and yard work   PERSONAL FACTORS Fitness, Past/current experiences, and Profession are also affecting patient's functional outcome.    REHAB POTENTIAL: Excellent   CLINICAL DECISION MAKING: Stable/uncomplicated   EVALUATION COMPLEXITY: Low     GOALS: Goals reviewed with patient? Yes   SHORT TERM GOALS: Target date: 12/22/2021  Patient will  be independent with initial HEP  Baseline: Goal status: Goal met 11/29/21  2.  Pain report to be no greater than 4/10  Baseline:  Goal status: Intermittent in range of pain.   3.  Left knee flexion AROM to 100 degrees Baseline: 90 Goal status: Goal met 12/03/21     LONG TERM GOALS: Target date: 01/19/2022    Patient to be independent with  advanced HEP  Baseline:  Goal status: Goal met 12/06/21   2.  Patient to report pain no greater than 2/10  Baseline:  Goal status: INITIAL   3.  Left knee flexion and extension to be within 5 degrees of uninvolved knee Baseline:  Goal status: INITIAL   4.  MMT to be 5/5 on left knee flexion and extension Baseline:  Goal status: INITIAL   5.  Patient to be able to ascend and descend steps with reciprocal gait without compensation Baseline:  Goal status: INITIAL   6.  Normalized gait with good heel to toe progression Baseline:  Goal status: INITIAL     PLAN: PT FREQUENCY: 3x/week   PT DURATION: 6 weeks   PLANNED INTERVENTIONS: Therapeutic exercises, Therapeutic activity, Neuromuscular re-education, Balance training, Gait training, Patient/Family education, Self Care, Joint mobilization, Stair training, DME instructions, Aquatic Therapy, Dry Needling, Electrical stimulation, Cryotherapy, Moist heat, Compression bandaging, Taping, Vasopneumatic device, Manual therapy, and Re-evaluation   PLAN FOR NEXT SESSION: Recumbent bike or Nustep for ROM,  progress quad rehab and functional strengthening      Myrene Galas, PTA 12/06/21 10:57 AM   [facility] [address] [place], [place] [address] Phone # [phone] Fax [phone]

## 2021-12-06 NOTE — Telephone Encounter (Signed)
Patient had a lt knee done on 11/16/21 and is requesting a refill on his pain med, Percocoet.     Last refill was 11/29/21    [facility] - [facility] [place], [place]

## 2021-12-06 NOTE — Telephone Encounter (Signed)
Pt is aware

## 2021-12-08 ENCOUNTER — Ambulatory Visit: Payer: BC Managed Care – PPO

## 2021-12-08 DIAGNOSIS — R252 Cramp and spasm: Secondary | ICD-10-CM

## 2021-12-08 DIAGNOSIS — R293 Abnormal posture: Secondary | ICD-10-CM

## 2021-12-08 DIAGNOSIS — M25562 Pain in left knee: Secondary | ICD-10-CM | POA: Diagnosis not present

## 2021-12-08 DIAGNOSIS — M25662 Stiffness of left knee, not elsewhere classified: Secondary | ICD-10-CM

## 2021-12-08 DIAGNOSIS — M6281 Muscle weakness (generalized): Secondary | ICD-10-CM

## 2021-12-08 DIAGNOSIS — R262 Difficulty in walking, not elsewhere classified: Secondary | ICD-10-CM

## 2021-12-08 NOTE — Therapy (Signed)
OUTPATIENT PHYSICAL THERAPY TREATMENT NOTE   Patient Name: Bobby House MRN: [id number] DOB:07/05/1960, 61 y.o., male Today's Date: 12/08/2021  PCP: Marius Ditch, MD REFERRING PROVIDER: Derry Skill, MD  END OF SESSION:   PT End of Session - 12/08/21 1026     Visit Number 8    Date for PT Re-Evaluation 01/18/22    Authorization Type BCBS    PT Start Time 1017    PT Stop Time 1100    PT Time Calculation (min) 43 min    Activity Tolerance Patient tolerated treatment well    Behavior During Therapy WFL for tasks assessed/performed               Past Medical History:  Diagnosis Date   ADD (attention deficit disorder)    DDD (degenerative disc disease), lumbar    Hypertension    Prostatitis    Umbilical hernia    Past Surgical History:  Procedure Laterality Date   LUMBAR DISC SURGERY     Patient Active Problem List   Diagnosis Date Noted   Ganglion cyst Left post lat knee 05/28/2021   Primary osteoarthritis of left knee 05/28/2021   Anxiety state, unspecified 01/09/2013   Attention deficit disorder without mention of hyperactivity 01/09/2013   Adjustment disorder with mixed anxiety and depressed mood 09/19/2012    REFERRING DIAG: I50.388 (ICD-10-CM) - Presence of left artificial knee joint   THERAPY DIAG:  Stiffness of left knee, not elsewhere classified  Acute pain of left knee  Muscle weakness (generalized)  Difficulty in walking, not elsewhere classified  Cramp and spasm  Abnormal posture  Rationale for Evaluation and Treatment Rehabilitation  PERTINENT HISTORY: hx lumbar fusion  PRECAUTIONS: none  SUBJECTIVE: No issues or complaints. No soreness.     PAIN:  Are you having pain? No just stiff   OBJECTIVE: (objective measures completed at initial evaluation unless otherwise dated)   OBJECTIVE:    DIAGNOSTIC FINDINGS: na   PATIENT SURVEYS:  FOTO 53 (goal is 45)   COGNITION:           Overall cognitive status: Within  functional limits for tasks assessed                          SENSATION: WFL   POSTURE:  left LE with pronounced knee varus     LOWER EXTREMITY ROM:   Active ROM Right eval Left eval Left 11/29/21  Knee flexion WNL 90 105  Knee extension WNL 0    (Blank rows = not tested)   LOWER EXTREMITY MMT:            Right LE generally 4+/5   Left knee not tested due to early post op.       FUNCTIONAL TESTS:  5 times sit to stand: 12.04 Timed up and go (TUG): 10.48   GAIT: Distance walked: 50 ft Assistive device utilized: None Level of assistance: Complete Independence Comments: antalgic   TODAY'S TREATMENT:   12/08/21: Recumbant bike x 5 min level 3 Step ups x 10 left Up and down steps x 3 with reciprocal gait Lateral step up x 10  Fwd step heel taps x 10 off bottom step Sit to stand x 10 with 15 lb kb Squat to chair with 2 balance pad x 10  LAQ x 20 with 5 lb both Lateral band walks with yellow loop 10 steps x 3 laps Supine quad sets with heavy verbal and tactile cues  for full engagement Game ready x 10 min  12/06/21:  Cable walk: 10x each direction 20x Seat 9: LT only 50# 10x, 70# 10x2  Vc to make sure he is contracting his quad. Step ups BOSU 10x Standing hams curls 3# 2x10 Sit to stand with 1 mat to sit on: 15# KB for upright row 2x10 Bike; 2 min ROM only full forward revolution. Seat all the way back.Then L1 x 10 min Game ready x 10 min to left knee in supine    12/03/21: Mini tramp weight shifting 1 min 3 ways Seat 9: LT only 50# 2x10 Vc to make sure he is contracting his quad. Step ups BOSU 10x Lateral step ups and down 2x10 Standing hams curls 3# 2x10 Sit to stand with 1 mat to sit on: 15# KB for upright row 2x10 Bike; 5 min ROM only full forward revolution. Seat all the way back.Then L1 x 5 min Game ready x 10 min to left knee in supine    TODAY'S TREATMENT: 12/01/21 Nustep x 5 min (PT present to discuss pain control and progress) Supine quad sets x  20 Supine TKE on small green noodle x 20 Supine SAQ over white foam roller x 20 with 4 lb Supine SAQ over larger teal bolster x 20 with 4 lb SLR x 20 with vc's for terminal extension with 4 lb Heel slides x 10 active Left Heel slides x 10 active assistive with strap x 10 Supine hip abduction x 20 Seated LAQ  x 20  Sit to stand 2 x 10 with vc's for appropriate weight shift with 15 lb kb Game ready x 10 min to left knee in supine  PATIENT EDUCATION:  Education details: Initiated HEP Person educated: Patient Education method: Consulting civil engineer, Media planner, Verbal cues, and Handouts Education comprehension: verbalized understanding, returned demonstration, and verbal cues required     HOME EXERCISE PROGRAM: Access Code: [email or web address] URL: [email or web address] Date: 11/23/2021 Prepared by: Candyce Churn   Exercises - Supine Knee Extension Mobilization with Weight  - 2 x daily - 7 x weekly - 1 sets - 1 reps - Supine Ankle Pumps  - 2 x daily - 7 x weekly - 1 sets - 30 reps - Supine Quadricep Sets  - 2 x daily - 7 x weekly - 1 sets - 20 reps - Supine Heel Slide  - 2 x daily - 7 x weekly - 3 sets - 10 reps - Supine Hip Abduction  - 2 x daily - 7 x weekly - 3 sets - 10 reps - Small Range Straight Leg Raise  - 1 x daily - 7 x weekly - 3 sets - 10 reps - Seated Long Arc Quad  - 1 x daily - 7 x weekly - 3 sets - 10 reps   ASSESSMENT:   CLINICAL IMPRESSION: Patient is progressing appropriately.  He is well motivated and compliant.  He has minimal swelling and good return of ROM.  He continues to lack full quad engagement at end range despite full extension but he is working on this diligently.  He should continue to improve.  He would benefit from skilled PT for post TKA protocol to restore to prior level of function and prepare for right TKA scheduled for October 2023.       OBJECTIVE IMPAIRMENTS Abnormal gait, difficulty walking, decreased ROM, decreased strength, increased  muscle spasms, and pain.    ACTIVITY LIMITATIONS carrying, lifting, bending, sitting, standing, squatting, sleeping, stairs, transfers, and dressing   PARTICIPATION  LIMITATIONS: meal prep, cleaning, laundry, driving, shopping, community activity, occupation, and yard work   PERSONAL FACTORS Fitness, Past/current experiences, and Profession are also affecting patient's functional outcome.    REHAB POTENTIAL: Excellent   CLINICAL DECISION MAKING: Stable/uncomplicated   EVALUATION COMPLEXITY: Low     GOALS: Goals reviewed with patient? Yes   SHORT TERM GOALS: Target date: 12/22/2021  Patient will be independent with initial HEP  Baseline: Goal status: Goal met 11/29/21  2.  Pain report to be no greater than 4/10  Baseline:  Goal status: Intermittent in range of pain.   3.  Left knee flexion AROM to 100 degrees Baseline: 90 Goal status: Goal met 12/03/21     LONG TERM GOALS: Target date: 01/19/2022    Patient to be independent with advanced HEP  Baseline:  Goal status: Goal met 12/06/21   2.  Patient to report pain no greater than 2/10  Baseline:  Goal status: INITIAL   3.  Left knee flexion and extension to be within 5 degrees of uninvolved knee Baseline:  Goal status: INITIAL   4.  MMT to be 5/5 on left knee flexion and extension Baseline:  Goal status: INITIAL   5.  Patient to be able to ascend and descend steps with reciprocal gait without compensation Baseline:  Goal status: INITIAL   6.  Normalized gait with good heel to toe progression Baseline:  Goal status: INITIAL     PLAN: PT FREQUENCY: 3x/week   PT DURATION: 6 weeks   PLANNED INTERVENTIONS: Therapeutic exercises, Therapeutic activity, Neuromuscular re-education, Balance training, Gait training, Patient/Family education, Self Care, Joint mobilization, Stair training, DME instructions, Aquatic Therapy, Dry Needling, Electrical stimulation, Cryotherapy, Moist heat, Compression bandaging, Taping,  Vasopneumatic device, Manual therapy, and Re-evaluation   PLAN FOR NEXT SESSION: Recumbent bike or Nustep for ROM,  progress quad rehab and functional strengthening    Jennifer B. Fields, PT 12/08/21 11:46 AM   [facility] [address], [place], [place] [address] Phone # [phone] Fax [phone]

## 2021-12-09 NOTE — Therapy (Signed)
OUTPATIENT PHYSICAL THERAPY TREATMENT NOTE   Patient Name: Bobby House MRN: [id number] DOB:[birth year], 61 y.o., male Today's Date: 12/10/2021  PCP: Marius Ditch, MD REFERRING PROVIDER: Derry Skill, MD  END OF SESSION:  12/10/2021 Visit: 9 Time in 10:18 am Time out: 11:06 am  Past Medical History:  Diagnosis Date   ADD (attention deficit disorder)    DDD (degenerative disc disease), lumbar    Hypertension    Prostatitis    Umbilical hernia    Past Surgical History:  Procedure Laterality Date   LUMBAR DISC SURGERY     Patient Active Problem List   Diagnosis Date Noted   Ganglion cyst Left post lat knee 05/28/2021   Primary osteoarthritis of left knee 05/28/2021   Anxiety state, unspecified 01/09/2013   Attention deficit disorder without mention of hyperactivity 01/09/2013   Adjustment disorder with mixed anxiety and depressed mood 09/19/2012    REFERRING DIAG: H47.654 (ICD-10-CM) - Presence of left artificial knee joint   THERAPY DIAG:  Stiffness of left knee, not elsewhere classified  Acute pain of left knee  Muscle weakness (generalized)  Difficulty in walking, not elsewhere classified  Cramp and spasm  Abnormal posture  Rationale for Evaluation and Treatment Rehabilitation  PERTINENT HISTORY: hx lumbar fusion  PRECAUTIONS: none  SUBJECTIVE: Pt states that he has been doing siding on his house and feeling great with no complaints.   PAIN:  Are you having pain? No just stiff   OBJECTIVE: (objective measures completed at initial evaluation unless otherwise dated)   OBJECTIVE:    DIAGNOSTIC FINDINGS: na   PATIENT SURVEYS:  FOTO 53 (goal is 32)   COGNITION:           Overall cognitive status: Within functional limits for tasks assessed                          SENSATION: WFL   POSTURE:  left LE with pronounced knee varus     LOWER EXTREMITY ROM:   Active ROM Right eval Left eval Left 11/29/21  Knee flexion WNL 90 105   Knee extension WNL 0    (Blank rows = not tested)   LOWER EXTREMITY MMT:            Right LE generally 4+/5   Left knee not tested due to early post op.       FUNCTIONAL TESTS:  5 times sit to stand: 12.04 Timed up and go (TUG): 10.48   GAIT: Distance walked: 50 ft Assistive device utilized: None Level of assistance: Complete Independence Comments: antalgic   TODAY'S TREATMENT:  12/10/21: Recumbant bike x 5 min level 3 Step ups 8" step x 20 left Up and down steps x 3 with reciprocal gait Lateral step up 8" step x 20  Lateral heel taps 2 x 10  4" step Sit to stand x 10 with 20 lb kb LAQ x 20 with 5 lb both Lateral band walks with blue loop 10 steps x 4 laps Supine SLR 2x10  Game ready x 10 min Educated pt on scar mobs and desensitization   12/08/21: Recumbant bike x 5 min level 3 Step ups x 10 left Up and down steps x 3 with reciprocal gait Lateral step up x 10  Fwd step heel taps x 10 off bottom step Sit to stand x 10 with 15 lb kb Squat to chair with 2 balance pad x 10  LAQ x 20 with 5 lb  both Lateral band walks with blue loop 10 steps x 3 laps Supine quad sets with heavy verbal and tactile cues for full engagement Game ready x 10 min  12/06/21:  Cable walk: 10x each direction 20x Seat 9: LT only 50# 10x, 70# 10x2  Vc to make sure he is contracting his quad. Step ups BOSU 10x Standing hams curls 3# 2x10 Sit to stand with 1 mat to sit on: 15# KB for upright row 2x10 Bike; 2 min ROM only full forward revolution. Seat all the way back.Then L1 x 10 min Game ready x 10 min to left knee in supine   PATIENT EDUCATION:  Education details: Initiated HEP Person educated: Patient Education method: Consulting civil engineer, Media planner, Verbal cues, and Handouts Education comprehension: verbalized understanding, returned demonstration, and verbal cues required     HOME EXERCISE PROGRAM: Access Code: [email or web address] URL: [email or web address] Date: 11/23/2021 Prepared  by: Candyce Churn   Exercises - Supine Knee Extension Mobilization with Weight  - 2 x daily - 7 x weekly - 1 sets - 1 reps - Supine Ankle Pumps  - 2 x daily - 7 x weekly - 1 sets - 30 reps - Supine Quadricep Sets  - 2 x daily - 7 x weekly - 1 sets - 20 reps - Supine Heel Slide  - 2 x daily - 7 x weekly - 3 sets - 10 reps - Supine Hip Abduction  - 2 x daily - 7 x weekly - 3 sets - 10 reps - Small Range Straight Leg Raise  - 1 x daily - 7 x weekly - 3 sets - 10 reps - Seated Long Arc Quad  - 1 x daily - 7 x weekly - 3 sets - 10 reps   ASSESSMENT:   CLINICAL IMPRESSION: Patient is progressing appropriately. He is well motivated and compliant. He has minimal swelling and good return of ROM. Pt tolerated increased weight with functional squats, but required reduced height for lateral step downs. Pt demonstrates improved quad control today with heel taps and SLR with no extensor lag noted. He should continue to improve. Encouraged pt to perform scar mobs and desensitization with different textures to help minimize scar tissue and sensitivity reported with palpation. He would benefit from skilled PT for post TKA protocol to restore to prior level of function and prepare for right TKA scheduled for October 2023.       OBJECTIVE IMPAIRMENTS Abnormal gait, difficulty walking, decreased ROM, decreased strength, increased muscle spasms, and pain.    ACTIVITY LIMITATIONS carrying, lifting, bending, sitting, standing, squatting, sleeping, stairs, transfers, and dressing   PARTICIPATION LIMITATIONS: meal prep, cleaning, laundry, driving, shopping, community activity, occupation, and yard work   PERSONAL FACTORS Fitness, Past/current experiences, and Profession are also affecting patient's functional outcome.    REHAB POTENTIAL: Excellent   CLINICAL DECISION MAKING: Stable/uncomplicated   EVALUATION COMPLEXITY: Low     GOALS: Goals reviewed with patient? Yes   SHORT TERM GOALS: Target date:  12/22/2021  Patient will be independent with initial HEP  Baseline: Goal status: Goal met 11/29/21  2.  Pain report to be no greater than 4/10  Baseline:  Goal status: Intermittent in range of pain.   3.  Left knee flexion AROM to 100 degrees Baseline: 90 Goal status: Goal met 12/03/21     LONG TERM GOALS: Target date: 01/19/2022    Patient to be independent with advanced HEP  Baseline:  Goal status: Goal met 12/06/21   2.  Patient to report  pain no greater than 2/10  Baseline:  Goal status: Met 12/10/2021   3.  Left knee flexion and extension to be within 5 degrees of uninvolved knee Baseline:  Goal status: INITIAL   4.  MMT to be 5/5 on left knee flexion and extension Baseline:  Goal status: INITIAL   5.  Patient to be able to ascend and descend steps with reciprocal gait without compensation Baseline:  Goal status: MET 12/10/2021   6.  Normalized gait with good heel to toe progression Baseline:  Goal status: INITIAL     PLAN: PT FREQUENCY: 3x/week   PT DURATION: 6 weeks   PLANNED INTERVENTIONS: Therapeutic exercises, Therapeutic activity, Neuromuscular re-education, Balance training, Gait training, Patient/Family education, Self Care, Joint mobilization, Stair training, DME instructions, Aquatic Therapy, Dry Needling, Electrical stimulation, Cryotherapy, Moist heat, Compression bandaging, Taping, Vasopneumatic device, Manual therapy, and Re-evaluation   PLAN FOR NEXT SESSION: Recumbent bike or Nustep for ROM,  progress quad rehab and functional strengthening. Review scar mobilizations as tolerated.    Rudi Heap PT, DPT 12/10/21  11:00 AM

## 2021-12-10 ENCOUNTER — Encounter: Payer: Self-pay | Admitting: Physical Therapy

## 2021-12-10 ENCOUNTER — Ambulatory Visit: Payer: BC Managed Care – PPO | Admitting: Physical Therapy

## 2021-12-10 DIAGNOSIS — M25562 Pain in left knee: Secondary | ICD-10-CM | POA: Diagnosis not present

## 2021-12-10 DIAGNOSIS — M25662 Stiffness of left knee, not elsewhere classified: Secondary | ICD-10-CM

## 2021-12-10 DIAGNOSIS — R252 Cramp and spasm: Secondary | ICD-10-CM

## 2021-12-10 DIAGNOSIS — M6281 Muscle weakness (generalized): Secondary | ICD-10-CM

## 2021-12-10 DIAGNOSIS — R293 Abnormal posture: Secondary | ICD-10-CM

## 2021-12-10 DIAGNOSIS — R262 Difficulty in walking, not elsewhere classified: Secondary | ICD-10-CM

## 2021-12-13 ENCOUNTER — Ambulatory Visit: Payer: BC Managed Care – PPO | Admitting: Physical Therapy

## 2021-12-17 ENCOUNTER — Ambulatory Visit: Payer: BC Managed Care – PPO

## 2021-12-22 ENCOUNTER — Ambulatory Visit: Payer: BC Managed Care – PPO | Attending: Orthopedic Surgery

## 2021-12-22 DIAGNOSIS — M25662 Stiffness of left knee, not elsewhere classified: Secondary | ICD-10-CM

## 2021-12-22 DIAGNOSIS — R262 Difficulty in walking, not elsewhere classified: Secondary | ICD-10-CM

## 2021-12-22 DIAGNOSIS — R293 Abnormal posture: Secondary | ICD-10-CM

## 2021-12-22 DIAGNOSIS — M6281 Muscle weakness (generalized): Secondary | ICD-10-CM | POA: Diagnosis present

## 2021-12-22 DIAGNOSIS — R252 Cramp and spasm: Secondary | ICD-10-CM | POA: Diagnosis present

## 2021-12-22 DIAGNOSIS — M25562 Pain in left knee: Secondary | ICD-10-CM | POA: Diagnosis present

## 2021-12-22 NOTE — Therapy (Signed)
OUTPATIENT PHYSICAL THERAPY TREATMENT NOTE   Patient Name: Bobby House MRN: [id number] DOB:[birth year], 61 y.o., male Today's Date: 12/22/2021  PCP: Marius Ditch, MD REFERRING PROVIDER: Derry Skill, MD  END OF SESSION:  12/10/2021 Visit: 9 Time in 10:18 am Time out: 11:06 am  Past Medical History:  Diagnosis Date   ADD (attention deficit disorder)    DDD (degenerative disc disease), lumbar    Hypertension    Prostatitis    Umbilical hernia    Past Surgical History:  Procedure Laterality Date   LUMBAR DISC SURGERY     Patient Active Problem List   Diagnosis Date Noted   Ganglion cyst Left post lat knee 05/28/2021   Primary osteoarthritis of left knee 05/28/2021   Anxiety state, unspecified 01/09/2013   Attention deficit disorder without mention of hyperactivity 01/09/2013   Adjustment disorder with mixed anxiety and depressed mood 09/19/2012    REFERRING DIAG: S85.462 (ICD-10-CM) - Presence of left artificial knee joint   THERAPY DIAG:  Stiffness of left knee, not elsewhere classified  Acute pain of left knee  Muscle weakness (generalized)  Difficulty in walking, not elsewhere classified  Cramp and spasm  Abnormal posture  Rationale for Evaluation and Treatment Rehabilitation  PERTINENT HISTORY: hx lumbar fusion  PRECAUTIONS: none  SUBJECTIVE: Pt states he is doing great.  Went to the beach to watch his grandkids and walked up and down the beach and got in the ocean with them.  "I had to climb on a ladder to repair some gutters and did just fine with that"  Has not seen surgeon for follow up but will call to see if he was supposed to schedule.    PAIN:  Are you having pain? No just stiff   OBJECTIVE: (objective measures completed at initial evaluation unless otherwise dated)   OBJECTIVE:    DIAGNOSTIC FINDINGS: na   PATIENT SURVEYS:  FOTO 53 (goal is 4)   COGNITION:           Overall cognitive status: Within functional limits for  tasks assessed                          SENSATION: WFL   POSTURE:  left LE with pronounced knee varus     LOWER EXTREMITY ROM:   Active ROM Right eval Left eval Left 11/29/21 Left 12/22/21  Knee flexion WNL 90 105 110  Knee extension WNL 0 0 0   (Blank rows = not tested)   LOWER EXTREMITY MMT:            Right LE generally 4+/5   Left knee not tested due to early post op.       FUNCTIONAL TESTS:  5 times sit to stand: 12.04 Timed up and go (TUG): 10.48   GAIT: Distance walked: 50 ft Assistive device utilized: None Level of assistance: Complete Independence Comments: antalgic   TODAY'S TREATMENT:  12/22/21: Recumbant bike x 5 min level 3 LAQ x 20 with 6 lb SLR x 20 with 6 lb Quad sets x 20 TKE with small foam roller x 20 with 6 lb SAQ with larger blue bolster x 20 with 6 lb Step ups 8" step x 20 left Lateral step up 8" step x 20  Game ready x 10 min   12/10/21: Recumbant bike x 5 min level 3 Step ups 8" step x 20 left Up and down steps x 3 with reciprocal gait Lateral step up 8"  step x 20  Lateral heel taps 2 x 10  4" step Sit to stand x 10 with 20 lb kb LAQ x 20 with 5 lb both Lateral band walks with blue loop 10 steps x 4 laps Supine SLR 2x10  Game ready x 10 min Educated pt on scar mobs and desensitization   12/08/21: Recumbant bike x 5 min level 3 Step ups x 10 left Up and down steps x 3 with reciprocal gait Lateral step up x 10  Fwd step heel taps x 10 off bottom step Sit to stand x 10 with 15 lb kb Squat to chair with 2 balance pad x 10  LAQ x 20 with 5 lb both Lateral band walks with blue loop 10 steps x 3 laps Supine quad sets with heavy verbal and tactile cues for full engagement Game ready x 10 min   PATIENT EDUCATION:  Education details: Initiated HEP Person educated: Patient Education method: Consulting civil engineer, Media planner, Verbal cues, and Handouts Education comprehension: verbalized understanding, returned demonstration, and verbal cues  required     HOME EXERCISE PROGRAM: Access Code: [email or web address] URL: [email or web address] Date: 11/23/2021 Prepared by: Candyce Churn   Exercises - Supine Knee Extension Mobilization with Weight  - 2 x daily - 7 x weekly - 1 sets - 1 reps - Supine Ankle Pumps  - 2 x daily - 7 x weekly - 1 sets - 30 reps - Supine Quadricep Sets  - 2 x daily - 7 x weekly - 1 sets - 20 reps - Supine Heel Slide  - 2 x daily - 7 x weekly - 3 sets - 10 reps - Supine Hip Abduction  - 2 x daily - 7 x weekly - 3 sets - 10 reps - Small Range Straight Leg Raise  - 1 x daily - 7 x weekly - 3 sets - 10 reps - Seated Long Arc Quad  - 1 x daily - 7 x weekly - 3 sets - 10 reps   ASSESSMENT:   CLINICAL IMPRESSION: Bobby House is progressing very well.  He now has full quad set at end range and full extension.  He measures 110 flexion.  Very good control on step ups.  Still having a little difficulty with eccentric control at a higher step level.  He is very compliant and well motivated.  He should continue to improve.    He would benefit from skilled PT for post TKA protocol to restore to prior level of function and prepare for right TKA scheduled for October 2023.       OBJECTIVE IMPAIRMENTS Abnormal gait, difficulty walking, decreased ROM, decreased strength, increased muscle spasms, and pain.    ACTIVITY LIMITATIONS carrying, lifting, bending, sitting, standing, squatting, sleeping, stairs, transfers, and dressing   PARTICIPATION LIMITATIONS: meal prep, cleaning, laundry, driving, shopping, community activity, occupation, and yard work   PERSONAL FACTORS Fitness, Past/current experiences, and Profession are also affecting patient's functional outcome.    REHAB POTENTIAL: Excellent   CLINICAL DECISION MAKING: Stable/uncomplicated   EVALUATION COMPLEXITY: Low     GOALS: Goals reviewed with patient? Yes   SHORT TERM GOALS: Target date: 12/22/2021  Patient will be independent with initial HEP   Baseline: Goal status: Goal met 11/29/21  2.  Pain report to be no greater than 4/10  Baseline:  Goal status: Intermittent in range of pain.   3.  Left knee flexion AROM to 100 degrees Baseline: 90 Goal status: Goal met 12/03/21     LONG TERM GOALS: Target  date: 01/19/2022    Patient to be independent with advanced HEP  Baseline:  Goal status: Goal met 12/06/21   2.  Patient to report pain no greater than 2/10  Baseline:  Goal status: Met 12/10/2021   3.  Left knee flexion and extension to be within 5 degrees of uninvolved knee Baseline:  Goal status: INITIAL   4.  MMT to be 5/5 on left knee flexion and extension Baseline:  Goal status: INITIAL   5.  Patient to be able to ascend and descend steps with reciprocal gait without compensation Baseline:  Goal status: MET 12/10/2021   6.  Normalized gait with good heel to toe progression Baseline:  Goal status: INITIAL     PLAN: PT FREQUENCY: 3x/week   PT DURATION: 6 weeks   PLANNED INTERVENTIONS: Therapeutic exercises, Therapeutic activity, Neuromuscular re-education, Balance training, Gait training, Patient/Family education, Self Care, Joint mobilization, Stair training, DME instructions, Aquatic Therapy, Dry Needling, Electrical stimulation, Cryotherapy, Moist heat, Compression bandaging, Taping, Vasopneumatic device, Manual therapy, and Re-evaluation   PLAN FOR NEXT SESSION: Recumbent bike or Nustep for ROM,  progress quad rehab and functional strengthening. Review scar mobilizations as tolerated.    Anderson Malta B. Leily Capek, PT 12/22/21 11:58 AM  [facility] [address], [place], [place] [address] Phone # [phone] Fax [phone]

## 2021-12-24 ENCOUNTER — Encounter: Payer: Self-pay | Admitting: Physical Therapy

## 2021-12-24 ENCOUNTER — Ambulatory Visit: Payer: BC Managed Care – PPO | Admitting: Physical Therapy

## 2021-12-24 DIAGNOSIS — R293 Abnormal posture: Secondary | ICD-10-CM

## 2021-12-24 DIAGNOSIS — M6281 Muscle weakness (generalized): Secondary | ICD-10-CM

## 2021-12-24 DIAGNOSIS — M25562 Pain in left knee: Secondary | ICD-10-CM

## 2021-12-24 DIAGNOSIS — R252 Cramp and spasm: Secondary | ICD-10-CM

## 2021-12-24 DIAGNOSIS — R262 Difficulty in walking, not elsewhere classified: Secondary | ICD-10-CM

## 2021-12-24 DIAGNOSIS — M25662 Stiffness of left knee, not elsewhere classified: Secondary | ICD-10-CM | POA: Diagnosis not present

## 2021-12-24 MED ORDER — CEPHALEXIN 500 MG PO CAPS
500 MG | ORAL_CAPSULE | Freq: Four times a day (QID) | ORAL | 0 refills | Status: AC
Start: 2021-12-24 — End: 2021-12-31

## 2021-12-24 NOTE — Therapy (Signed)
OUTPATIENT PHYSICAL THERAPY TREATMENT NOTE   Patient Name: Bobby House MRN: [id number] DOB:1961/03/30, 61 y.o., male Today's Date: 12/24/2021  PCP: Marius Ditch, MD REFERRING PROVIDER: Derry Skill, MD  END OF SESSION:  12/10/2021 Visit: 9 Time in 10:18 am Time out: 11:06 am  Past Medical History:  Diagnosis Date   ADD (attention deficit disorder)    DDD (degenerative disc disease), lumbar    Hypertension    Prostatitis    Umbilical hernia    Past Surgical History:  Procedure Laterality Date   LUMBAR DISC SURGERY     Patient Active Problem List   Diagnosis Date Noted   Ganglion cyst Left post lat knee 05/28/2021   Primary osteoarthritis of left knee 05/28/2021   Anxiety state, unspecified 01/09/2013   Attention deficit disorder without mention of hyperactivity 01/09/2013   Adjustment disorder with mixed anxiety and depressed mood 09/19/2012    REFERRING DIAG: V94.801 (ICD-10-CM) - Presence of left artificial knee joint   THERAPY DIAG:  Stiffness of left knee, not elsewhere classified  Acute pain of left knee  Muscle weakness (generalized)  Difficulty in walking, not elsewhere classified  Cramp and spasm  Abnormal posture  Rationale for Evaluation and Treatment Rehabilitation  PERTINENT HISTORY: hx lumbar fusion  PRECAUTIONS: none  SUBJECTIVE: Pt sustained some kind of bite on his incision. It is slightly red and sore. Pt has call into MD to discuss. Pt verbally understands signs and symptoms of infection.   PAIN:  Are you having pain? No just stiff   OBJECTIVE: (objective measures completed at initial evaluation unless otherwise dated)   OBJECTIVE:    DIAGNOSTIC FINDINGS: na   PATIENT SURVEYS:  FOTO 53 (goal is 9)   COGNITION:           Overall cognitive status: Within functional limits for tasks assessed                          SENSATION: WFL   POSTURE:  left LE with pronounced knee varus     LOWER EXTREMITY ROM:    Active ROM Right eval Left eval Left 11/29/21 Left 12/22/21  Knee flexion WNL 90 105 110  Knee extension WNL 0 0 0   (Blank rows = not tested)   LOWER EXTREMITY MMT:            Right LE generally 4+/5   Left knee not tested due to early post op.       FUNCTIONAL TESTS:  5 times sit to stand: 12.04 Timed up and go (TUG): 10.48   GAIT: Distance walked: 50 ft Assistive device utilized: None Level of assistance: Complete Independence Comments: antalgic   TODAY'S TREATMENT:    12/24/21: Recumbant bike x 6 min level 3 LAQ 7# 2x10 SAQ over blue bolster 7# 2x10 Intermittent bounding on rebounder 3x 30 sec 8" box step ups with SLS on top 2x10 Game Ready 10 min 3 flakes medium  Reverse cable walking 20x      12/22/21: Recumbant bike x 5 min level 3 LAQ x 20 with 6 lb SLR x 20 with 6 lb Quad sets x 20 TKE with small foam roller x 20 with 6 lb SAQ with larger blue bolster x 20 with 6 lb Step ups 8" step x 20 left Lateral step up 8" step x 20  Game ready x 10 min   12/10/21: Recumbant bike x 5 min level 3 Step ups 8" step x  20 left Up and down steps x 3 with reciprocal gait Lateral step up 8" step x 20  Lateral heel taps 2 x 10  4" step Sit to stand x 10 with 20 lb kb LAQ x 20 with 5 lb both Lateral band walks with blue loop 10 steps x 4 laps Supine SLR 2x10  Game ready x 10 min Educated pt on scar mobs and desensitization     PATIENT EDUCATION:  Education details: Initiated HEP Person educated: Patient Education method: Consulting civil engineer, Media planner, Verbal cues, and Handouts Education comprehension: verbalized understanding, returned demonstration, and verbal cues required     HOME EXERCISE PROGRAM: Access Code: [email or web address] URL: [email or web address] Date: 11/23/2021 Prepared by: Candyce Churn   Exercises - Supine Knee Extension Mobilization with Weight  - 2 x daily - 7 x weekly - 1 sets - 1 reps - Supine Ankle Pumps  - 2 x daily - 7 x weekly - 1  sets - 30 reps - Supine Quadricep Sets  - 2 x daily - 7 x weekly - 1 sets - 20 reps - Supine Heel Slide  - 2 x daily - 7 x weekly - 3 sets - 10 reps - Supine Hip Abduction  - 2 x daily - 7 x weekly - 3 sets - 10 reps - Small Range Straight Leg Raise  - 1 x daily - 7 x weekly - 3 sets - 10 reps - Seated Long Arc Quad  - 1 x daily - 7 x weekly - 3 sets - 10 reps   ASSESSMENT:   CLINICAL IMPRESSION: Pt arrives with reports of getting what looks like a bite of some kind on his incision. He has a call into the doctor to discuss this. This did not negatively affect his exercises.    OBJECTIVE IMPAIRMENTS Abnormal gait, difficulty walking, decreased ROM, decreased strength, increased muscle spasms, and pain.    ACTIVITY LIMITATIONS carrying, lifting, bending, sitting, standing, squatting, sleeping, stairs, transfers, and dressing   PARTICIPATION LIMITATIONS: meal prep, cleaning, laundry, driving, shopping, community activity, occupation, and yard work   PERSONAL FACTORS Fitness, Past/current experiences, and Profession are also affecting patient's functional outcome.    REHAB POTENTIAL: Excellent   CLINICAL DECISION MAKING: Stable/uncomplicated   EVALUATION COMPLEXITY: Low     GOALS: Goals reviewed with patient? Yes   SHORT TERM GOALS: Target date: 12/22/2021  Patient will be independent with initial HEP  Baseline: Goal status: Goal met 11/29/21  2.  Pain report to be no greater than 4/10  Baseline:  Goal status: Intermittent in range of pain.   3.  Left knee flexion AROM to 100 degrees Baseline: 90 Goal status: Goal met 12/03/21     LONG TERM GOALS: Target date: 01/19/2022    Patient to be independent with advanced HEP  Baseline:  Goal status: Goal met 12/06/21   2.  Patient to report pain no greater than 2/10  Baseline:  Goal status: Met 12/10/2021   3.  Left knee flexion and extension to be within 5 degrees of uninvolved knee Baseline:  Goal status: INITIAL   4.  MMT  to be 5/5 on left knee flexion and extension Baseline:  Goal status: INITIAL   5.  Patient to be able to ascend and descend steps with reciprocal gait without compensation Baseline:  Goal status: MET 12/10/2021   6.  Normalized gait with good heel to toe progression Baseline:  Goal status: INITIAL     PLAN: PT FREQUENCY: 3x/week   PT  DURATION: 6 weeks   PLANNED INTERVENTIONS: Therapeutic exercises, Therapeutic activity, Neuromuscular re-education, Balance training, Gait training, Patient/Family education, Self Care, Joint mobilization, Stair training, DME instructions, Aquatic Therapy, Dry Needling, Electrical stimulation, Cryotherapy, Moist heat, Compression bandaging, Taping, Vasopneumatic device, Manual therapy, and Re-evaluation   PLAN FOR NEXT SESSION: Recumbent bike or Nustep for ROM,  progress quad rehab and functional strengthening. Review scar mobilizations as tolerated.   Myrene Galas, PTA 12/24/21 2:41 PM   [facility] [address] [place], [place] [address] Phone # [phone] Fax [phone]

## 2021-12-24 NOTE — Telephone Encounter (Signed)
LT TKR was 11/16/21.     Patient states he is healing great but had a spot on the incision that looked like a bug or spider bite and it popped this morning with blood and pus.. He would like to know if he will need an antibiotic or what should he do?    Contact:[phone]

## 2021-12-27 ENCOUNTER — Encounter: Payer: Self-pay | Admitting: Physical Therapy

## 2021-12-27 ENCOUNTER — Ambulatory Visit: Payer: BC Managed Care – PPO | Admitting: Physical Therapy

## 2021-12-27 DIAGNOSIS — M25662 Stiffness of left knee, not elsewhere classified: Secondary | ICD-10-CM

## 2021-12-27 DIAGNOSIS — R293 Abnormal posture: Secondary | ICD-10-CM

## 2021-12-27 DIAGNOSIS — R262 Difficulty in walking, not elsewhere classified: Secondary | ICD-10-CM

## 2021-12-27 DIAGNOSIS — M6281 Muscle weakness (generalized): Secondary | ICD-10-CM

## 2021-12-27 DIAGNOSIS — R252 Cramp and spasm: Secondary | ICD-10-CM

## 2021-12-27 DIAGNOSIS — M25562 Pain in left knee: Secondary | ICD-10-CM

## 2021-12-27 NOTE — Therapy (Signed)
OUTPATIENT PHYSICAL THERAPY TREATMENT NOTE   Patient Name: Bobby House MRN: [id number] DOB:1961-03-18, 61 y.o., male Today's Date: 12/27/2021  PCP: Marius Ditch, MD REFERRING PROVIDER: Derry Skill, MD  END OF SESSION:  12/10/2021 Visit: 9 Time in 10:18 am Time out: 11:06 am  Past Medical History:  Diagnosis Date   ADD (attention deficit disorder)    DDD (degenerative disc disease), lumbar    Hypertension    Prostatitis    Umbilical hernia    Past Surgical History:  Procedure Laterality Date   LUMBAR DISC SURGERY     Patient Active Problem List   Diagnosis Date Noted   Ganglion cyst Left post lat knee 05/28/2021   Primary osteoarthritis of left knee 05/28/2021   Anxiety state, unspecified 01/09/2013   Attention deficit disorder 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 MD thinks a stich is trying to poke through. Pt's distal scar is covered with bandage.   PAIN:  Are you having pain? No just stiff and a little tender around the potential infection.    OBJECTIVE: (objective measures completed at initial evaluation unless otherwise dated)   OBJECTIVE:    DIAGNOSTIC FINDINGS: na   PATIENT SURVEYS:  FOTO 53 (goal is 28)   COGNITION:           Overall cognitive status: Within functional limits for tasks assessed                          SENSATION: WFL   POSTURE:  left LE with pronounced knee varus      LOWER EXTREMITY ROM:   Active ROM Right eval Left eval Left 11/29/21 Left 12/22/21  Knee flexion WNL 90 105 110  Knee extension WNL 0 0 0   (Blank rows = not tested)   LOWER EXTREMITY MMT:            Right LE generally 4+/5   Left knee not tested due to early post op.       FUNCTIONAL TESTS:  5 times sit to stand: 12.04 Timed up and go (TUG): 10.48   GAIT: Distance walked: 50 ft Assistive device utilized: None Level of assistance: Complete Independence Comments: antalgic   TODAY'S TREATMENT:     12/27/21: Leg press Lt 55# x10, 60# 10x,    Recumbant bike x 10 min level 3, PTA present to  LAQ 7# 2x10 SAQ over blue bolster 7# 2x10 Intermittent bounding on rebounder 3x 30 sec 8" box step ups with SLS on top 3x10 Game Ready 10 min 3 flakes medium  Reverse cable walking 30# 10x    12/24/21: Recumbant bike x 6 min level 3 LAQ 7# 2x10 SAQ over blue bolster 7# 2x10 Intermittent bounding on rebounder 3x 30 sec 8" box step ups with SLS on top 2x10 Game Ready 10 min 3 flakes medium  Reverse cable walking 20x       12/22/21: Recumbant bike x 5 min level 3 LAQ x 20  with 6 lb SLR x 20 with 6 lb Quad sets x 20 TKE with small foam roller x 20 with 6 lb SAQ with larger blue bolster x 20 with 6 lb Step ups 8" step x 20 left Lateral step up 8" step x 20  Game ready x 10 min    PATIENT EDUCATION:  Education details: Initiated HEP Person educated: Patient Education method: Consulting civil engineer, Media planner, Verbal cues, and Handouts Education comprehension: verbalized understanding, returned demonstration, and verbal cues required     HOME EXERCISE PROGRAM: Access Code: [email or web address] URL: [email or web address] Date: 11/23/2021 Prepared by: Candyce Churn   Exercises - Supine Knee Extension Mobilization with Weight  - 2 x daily - 7 x weekly - 1 sets - 1 reps - Supine Ankle Pumps  - 2 x daily - 7 x weekly - 1 sets - 30 reps - Supine Quadricep Sets  - 2 x daily - 7 x  weekly - 1 sets - 20 reps - Supine Heel Slide  - 2 x daily - 7 x weekly - 3 sets - 10 reps - Supine Hip Abduction  - 2 x daily - 7 x weekly - 3 sets - 10 reps - Small Range Straight Leg Raise  - 1 x daily - 7 x weekly - 3 sets - 10 reps - Seated Long Arc Quad  - 1 x daily - 7 x weekly - 3 sets - 10 reps   ASSESSMENT:   CLINICAL IMPRESSION: Pt reports he probably does have an infection after communicating with his MD. He is now taking an antibiotic. The infection/soreness did not impact his ability to perform any exercises today.    OBJECTIVE IMPAIRMENTS Abnormal gait, difficulty walking, decreased ROM, decreased strength, increased muscle spasms, and pain.    ACTIVITY LIMITATIONS carrying, lifting, bending, sitting, standing, squatting, sleeping, stairs, transfers, and dressing   PARTICIPATION LIMITATIONS: meal prep, cleaning, laundry, driving, shopping, community activity, occupation, and yard work   PERSONAL FACTORS Fitness, Past/current experiences, and Profession are also affecting patient's functional outcome.    REHAB POTENTIAL: Excellent   CLINICAL DECISION MAKING: Stable/uncomplicated   EVALUATION COMPLEXITY: Low     GOALS: Goals reviewed with patient? Yes   SHORT TERM GOALS: Target date: 12/22/2021  Patient will be independent with initial HEP  Baseline: Goal status: Goal met 11/29/21  2.  Pain report to be no greater than 4/10  Baseline:  Goal status: Intermittent in range of pain.   3.  Left knee flexion AROM to 100 degrees Baseline: 90 Goal status: Goal met 12/03/21     LONG TERM GOALS: Target date: 01/19/2022    Patient to be independent with advanced HEP  Baseline:  Goal status: Goal met 12/06/21   2.  Patient to report pain no greater than 2/10  Baseline:  Goal status: Met 12/10/2021   3.  Left knee flexion and extension to be within 5 degrees of uninvolved knee Baseline:  Goal status: INITIAL   4.  MMT to be 5/5 on left knee flexion and  extension Baseline:  Goal status: INITIAL   5.  Patient to be able to ascend and descend steps with reciprocal gait without compensation Baseline:  Goal status: MET 12/10/2021   6.  Normalized gait with good heel to toe progression Baseline:  Goal status: INITIAL     PLAN: PT FREQUENCY: 3x/week   PT DURATION: 6 weeks   PLANNED INTERVENTIONS: Therapeutic exercises, Therapeutic activity, Neuromuscular re-education, Balance training, Gait training, Patient/Family education, Self Care, Joint  mobilization, Stair training, DME instructions, Aquatic Therapy, Dry Needling, Electrical stimulation, Cryotherapy, Moist heat, Compression bandaging, Taping, Vasopneumatic device, Manual therapy, and Re-evaluation   PLAN FOR NEXT SESSION: Recumbent bike or Nustep for ROM,  progress quad rehab and functional strengthening. Review scar mobilizations as tolerated.   Myrene Galas, PTA 12/27/21 10:57 AM   [facility] [address] [place], [place] [address] Phone # [phone] Fax [phone]

## 2021-12-29 ENCOUNTER — Ambulatory Visit: Payer: BC Managed Care – PPO

## 2021-12-29 DIAGNOSIS — M25562 Pain in left knee: Secondary | ICD-10-CM

## 2021-12-29 DIAGNOSIS — M25662 Stiffness of left knee, not elsewhere classified: Secondary | ICD-10-CM | POA: Diagnosis not present

## 2021-12-29 DIAGNOSIS — M6281 Muscle weakness (generalized): Secondary | ICD-10-CM

## 2021-12-29 DIAGNOSIS — R262 Difficulty in walking, not elsewhere classified: Secondary | ICD-10-CM

## 2021-12-29 NOTE — Therapy (Addendum)
OUTPATIENT PHYSICAL THERAPY DISCHARGE NOTE   Patient Name: Bobby House MRN: [id number] DOB:1960-07-06, 61 y.o., male Today's Date: 12/29/2021  PCP: Marius Ditch, MD REFERRING PROVIDER: Derry Skill, MD  END OF SESSION:  12/10/2021 Visit: 9 Time in 10:18 am Time out: 11:06 am  Past Medical History:  Diagnosis Date   ADD (attention deficit disorder)    DDD (degenerative disc disease), lumbar    Hypertension    Prostatitis    Umbilical hernia    Past Surgical History:  Procedure Laterality Date   LUMBAR DISC SURGERY     Patient Active Problem List   Diagnosis Date Noted   Ganglion cyst Left post lat knee 05/28/2021   Primary osteoarthritis of left knee 05/28/2021   Anxiety state, unspecified 01/09/2013   Attention deficit disorder without mention of hyperactivity 01/09/2013   Adjustment disorder with mixed anxiety and depressed mood 09/19/2012    REFERRING DIAG: F38.329 (ICD-10-CM) - Presence of left artificial knee joint   THERAPY DIAG:  Stiffness of left knee, not elsewhere classified  Acute pain of left knee  Muscle weakness (generalized)  Difficulty in walking, not elsewhere classified  Rationale for Evaluation and Treatment Rehabilitation  PERTINENT HISTORY: hx lumbar fusion  PRECAUTIONS: none  SUBJECTIVE: MD has pt on antibiotics for potential infection. MD thinks a stich is trying to poke through. Pt's distal scar is covered with bandage.   PAIN:  Are you having pain? No just stiff and a little tender around the potential infection.    OBJECTIVE: (objective measures completed at initial evaluation unless otherwise dated)   OBJECTIVE:    DIAGNOSTIC FINDINGS: na   PATIENT SURVEYS:  FOTO 53 (goal is 18)    12/29/21: 70 (questions about running and making sharp turns difficult to assess due to patients right knee in need of TKA) COGNITION:           Overall cognitive status: Within functional limits for tasks assessed                           SENSATION: WFL   POSTURE:  left LE with pronounced knee varus     LOWER EXTREMITY ROM:   Active ROM Right eval Left eval Left 11/29/21 Left 12/22/21 Left  12/29/21  Knee flexion WNL 90 105 110 WFL  Knee extension WNL 0 0 0 0   (Blank rows = not tested)   LOWER EXTREMITY MMT:            Right LE generally 4+/5   Left knee generally 4+/5.       FUNCTIONAL TESTS:  Initial eval: 5 times sit to stand: 12.04   Timed up and go (TUG): 10.48  DC: 12/29/21: 5 times sit to stand: estimated 8.5 sec   Timed up and go (TUG): Estimated 7 sec   GAIT: 12/29/21 Distance walked: 300 ft Assistive device utilized: None Level of assistance: Complete Independence Comments: antalgic   TODAY'S TREATMENT:   12/29/21: DC visit assessment Reviewed and updated HEP and how to progress until upcoming right TKA   12/27/21: Leg press Lt 55# x10, 60# 10x,    Recumbant bike x 10 min level 3, PTA present to  LAQ 7# 2x10 SAQ over blue bolster 7# 2x10 Intermittent bounding on rebounder 3x 30 sec 8" box step ups with SLS on top 3x10 Game Ready 10 min 3 flakes medium  Reverse cable walking 30# 10x    12/24/21: Recumbant bike x 6  min level 3 LAQ 7# 2x10 SAQ over blue bolster 7# 2x10 Intermittent bounding on rebounder 3x 30 sec 8" box step ups with SLS on top 2x10 Game Ready 10 min 3 flakes medium  Reverse cable walking 20x   PATIENT EDUCATION:  Education details: Initiated HEP Person educated: Patient Education method: Consulting civil engineer, Media planner, Verbal cues, and Handouts Education comprehension: verbalized understanding, returned demonstration, and verbal cues required     HOME EXERCISE PROGRAM: Access Code: [email or web address] URL: [email or web address] Date: 12/29/2021 Prepared by: Candyce Churn  Exercises - Recumbent Bike  - 1 x daily - 7 x weekly - 3 sets - 10 reps - Supine Quadricep Sets  - 1 x daily - 7 x weekly - 3 sets - 10 reps - Small Range Straight Leg Raise  - 1  x daily - 7 x weekly - 3 sets - 10 reps - Seated Long Arc Quad  - 1 x daily - 7 x weekly - 3 sets - 10 reps - Prone Quadriceps Stretch with Strap  - 1 x daily - 7 x weekly - 3 sets - 10 reps - Squat with Chair Touch  - 1 x daily - 7 x weekly - 3 sets - 10 reps - Side Stepping with Resistance at Feet  - 1 x daily - 7 x weekly - 3 sets - 10 reps - Step Up  - 1 x daily - 7 x weekly - 3 sets - 10 reps - Backward Step Up  - 1 x daily - 7 x weekly - 3 sets - 10 reps - Single Leg Balance with Same Side Arm Star Reach  - 1 x daily - 7 x weekly - 3 sets - 10 reps - Kettlebell Deadlift  - 1 x daily - 7 x weekly - 3 sets - 10 reps - Single Leg Deadlift with Kettlebell  - 1 x daily - 7 x weekly - 3 sets - 10 reps - Single-Leg [place] Deadlift With Dumbbell  - 1 x daily - 7 x weekly - 3 sets - 10 repsAccess Code: [email or web address] URL: [email or web address] Date: 11/23/2021 Prepared by: Candyce Churn   Exercises - Supine Knee Extension Mobilization with Weight  - 2 x daily - 7 x weekly - 1 sets - 1 reps - Supine Ankle Pumps  - 2 x daily - 7 x weekly - 1 sets - 30 reps - Supine Quadricep Sets  - 2 x daily - 7 x weekly - 1 sets - 20 reps - Supine Heel Slide  - 2 x daily - 7 x weekly - 3 sets - 10 reps - Supine Hip Abduction  - 2 x daily - 7 x weekly - 3 sets - 10 reps - Small Range Straight Leg Raise  - 1 x daily - 7 x weekly - 3 sets - 10 reps - Seated Long Arc Quad  - 1 x daily - 7 x weekly - 3 sets - 10 reps   ASSESSMENT:   CLINICAL IMPRESSION: Pt appears to be healing well with infection issues. Taking antibiotic and using colloid dressing.  He has met and exceeded all goals at this time.  He is in agreement with DC from formal PT for left knee and will resume PT after his right TKA.     OBJECTIVE IMPAIRMENTS Abnormal gait, difficulty walking, decreased ROM, decreased strength, increased muscle spasms, and pain.    ACTIVITY LIMITATIONS carrying, lifting, bending, sitting, standing,  squatting, sleeping, stairs, transfers, and dressing   PARTICIPATION LIMITATIONS:  meal prep, cleaning, laundry, driving, shopping, community activity, occupation, and yard work   PERSONAL FACTORS Fitness, Past/current experiences, and Profession are also affecting patient's functional outcome.    REHAB POTENTIAL: Excellent   CLINICAL DECISION MAKING: Stable/uncomplicated   EVALUATION COMPLEXITY: Low     GOALS: Goals reviewed with patient? Yes   SHORT TERM GOALS: Target date: 12/22/2021  Patient will be independent with initial HEP  Baseline: Goal status: Goal met 11/29/21  2.  Pain report to be no greater than 4/10  Baseline:  Goal status: MET   3.  Left knee flexion AROM to 100 degrees Baseline: 90 Goal status: Goal met 12/03/21     LONG TERM GOALS: Target date: 01/19/2022    Patient to be independent with advanced HEP  Baseline:  Goal status: Goal met 12/06/21   2.  Patient to report pain no greater than 2/10  Baseline:  Goal status: Met 12/10/2021   3.  Left knee flexion and extension to be within 5 degrees of uninvolved knee Baseline:  Goal status: MET   4.  MMT to be 5/5 on left knee flexion and extension Baseline:  Goal status: Partially met 4+/5   5.  Patient to be able to ascend and descend steps with reciprocal gait without compensation Baseline:  Goal status: MET 12/10/2021   6.  Normalized gait with good heel to toe progression Baseline:  Goal status: Partially met (right knee pain causing some abnormal gait)     PLAN: PT FREQUENCY: 3x/week   PT DURATION: 6 weeks   PLANNED INTERVENTIONS: Therapeutic exercises, Therapeutic activity, Neuromuscular re-education, Balance training, Gait training, Patient/Family education, Self Care, Joint mobilization, Stair training, DME instructions, Aquatic Therapy, Dry Needling, Electrical stimulation, Cryotherapy, Moist heat, Compression bandaging, Taping, Vasopneumatic device, Manual therapy, and Re-evaluation    PLAN FOR NEXT SESSION: We will DC at this time for left knee.   PHYSICAL THERAPY DISCHARGE SUMMARY  Visits from Start of Care: 9  Current functional level related to goals / functional outcomes: See above   Remaining deficits: See above   Education / Equipment: See above   Patient agrees to discharge. Patient goals were met. Patient is being discharged due to meeting the stated rehab goals.  Anderson Malta B. Nahomi Hegner, PT 12/29/21 1:26 PM  [facility] [address] [place], [place] [address] Phone # [phone] Fax [phone]

## 2021-12-31 ENCOUNTER — Encounter: Payer: BC Managed Care – PPO | Admitting: Rehabilitative and Restorative Service Providers"

## 2022-01-03 ENCOUNTER — Ambulatory Visit: Payer: BC Managed Care – PPO | Admitting: Physical Therapy

## 2022-01-07 ENCOUNTER — Encounter: Payer: BC Managed Care – PPO | Admitting: Physical Therapy

## 2022-01-10 ENCOUNTER — Encounter: Payer: BC Managed Care – PPO | Admitting: Physical Therapy

## 2022-01-14 ENCOUNTER — Encounter: Payer: BC Managed Care – PPO | Admitting: Physical Therapy

## 2022-01-21 ENCOUNTER — Encounter

## 2022-01-31 ENCOUNTER — Ambulatory Visit: Admit: 2022-01-31 | Discharge: 2022-01-31 | Payer: BLUE CROSS/BLUE SHIELD | Attending: Orthopaedic Surgery

## 2022-01-31 ENCOUNTER — Encounter: Payer: BLUE CROSS/BLUE SHIELD | Attending: Orthopaedic Surgery

## 2022-01-31 DIAGNOSIS — M1711 Unilateral primary osteoarthritis, right knee: Secondary | ICD-10-CM

## 2022-01-31 MED ORDER — CEPHALEXIN 500 MG PO CAPS
500 MG | ORAL_CAPSULE | Freq: Three times a day (TID) | ORAL | 0 refills | Status: AC
Start: 2022-01-31 — End: 2022-02-03

## 2022-01-31 MED ORDER — ONDANSETRON 8 MG PO TBDP
8 MG | ORAL_TABLET | Freq: Three times a day (TID) | ORAL | 0 refills | Status: AC | PRN
Start: 2022-01-31 — End: 2022-02-07

## 2022-01-31 MED ORDER — ASPIRIN 325 MG PO TABS
325 MG | ORAL_TABLET | Freq: Two times a day (BID) | ORAL | 0 refills | Status: AC
Start: 2022-01-31 — End: ?

## 2022-01-31 MED ORDER — HYDROMORPHONE HCL 4 MG PO TABS
4 MG | ORAL_TABLET | ORAL | 0 refills | Status: DC | PRN
Start: 2022-01-31 — End: 2022-02-07

## 2022-01-31 MED ORDER — OXYCODONE-ACETAMINOPHEN 5-325 MG PO TABS
5-325 MG | ORAL_TABLET | ORAL | 0 refills | Status: DC | PRN
Start: 2022-01-31 — End: 2022-01-31

## 2022-01-31 NOTE — Patient Instructions (Signed)
Knee Arthritis: Care Instructions  Overview     Knee arthritis is a breakdown of the cartilage that cushions your knee joint. When the cartilage wears down, your bones rub against each other. This causes pain and stiffness. Knee arthritis tends to get worse with time.  Treatment for knee arthritis involves reducing pain, making the leg muscles stronger, and staying at a healthy body weight. The treatment usually does not improve the health of the cartilage, but it can reduce pain and improve how well your knee works.  You can take simple measures to protect your knee joints, ease your pain, and help you stay active.  Follow-up care is a key part of your treatment and safety. Be sure to make and go to all appointments, and call your doctor if you are having problems. It's also a good idea to know your test results and keep a list of the medicines you take.  How can you care for yourself at home?  Know that knee arthritis will cause more pain on some days than on others.  Stay at a healthy weight. Lose weight if you are overweight. When you stand up, the pressure on your knees from every pound of body weight is multiplied four times. So if you lose 10 pounds, you will reduce the pressure on your knees by 40 pounds.  Talk to your doctor or physical therapist about exercises that will help ease joint pain.  Stretch to help prevent stiffness and to prevent injury before you exercise. You may enjoy gentle forms of yoga to help keep your knee joints and muscles flexible.  Walk instead of jog.  Ride a bike. This makes your thigh muscles stronger and takes pressure off your knee.  Wear well-fitting and comfortable shoes.  Exercise in chest-deep water. This can help you exercise longer with less pain.  Avoid exercises that include squatting or kneeling. They can put a lot of strain on your knees.  Talk to your doctor to make sure that the exercise you do is not making the arthritis worse.  Do not sit for long periods of time.  Try to walk once in a while to keep your knee from getting stiff.  Ask your doctor or physical therapist whether shoe inserts may reduce your arthritis pain.  If you can afford it, get new athletic shoes at least every year. This can help reduce the strain on your knees.  Use a device to help you do everyday activities.  A cane or walking stick can help you keep your balance when you walk. Hold the cane or walking stick in the hand opposite the painful knee.  If you feel like you may fall when you walk, try using crutches or a front-wheeled walker. These can prevent falls that could cause more damage to your knee.  A knee brace may help keep your knee stable and prevent pain.  You also can use other things to make life easier, such as a higher toilet seat and handrails in the bathtub or shower.  Take pain medicines exactly as directed.  Do not wait until you are in severe pain. You will get better results if you take it sooner.  If you are not taking a prescription pain medicine, take an over-the-counter medicine such as acetaminophen (Tylenol), ibuprofen (Advil, Motrin), or naproxen (Aleve). Read and follow all instructions on the label.  Do not take two or more pain medicines at the same time unless the doctor told you to. Many pain   medicines have acetaminophen, which is Tylenol. Too much acetaminophen (Tylenol) can be harmful.  Tell your doctor if you take a blood thinner, have diabetes, or have allergies to shellfish.  Ask your doctor if you might benefit from a shot of steroid medicine into your knee. This may provide pain relief for several months.  Many people take the supplements glucosamine and chondroitin for osteoarthritis. Some people feel they help, but the medical research does not show that they work. Talk to your doctor before you take these supplements.  When should you call for help?   Call your doctor now or seek immediate medical care if:    You have sudden swelling, warmth, or pain in your knee.      You have knee pain and a fever or rash.     You have such bad pain that you cannot use your knee.   Watch closely for changes in your health, and be sure to contact your doctor if you have any problems.  Where can you learn more?  Go to https://www.healthwise.net/patientEd and enter W187 to learn more about "Knee Arthritis: Care Instructions."  Current as of: June 24, 2020               Content Version: 13.5   2006-2022 Healthwise, Inc[employer].   Care instructions adapted under license by [facility]. If you have questions about a medical condition or this instruction, always ask your healthcare professional. Healthwise, Incorporated disclaims any warranty or liability for your use of this information.

## 2022-01-31 NOTE — Progress Notes (Signed)
Name: Travis Montgomery    DOB: Jul 24, 1960     [place]  [address] [place] [address]  Dept: [phone]  Dept Fax: [phone]     Chief Complaint   Patient presents with    Pre-op Exam    Knee Pain        There were no vitals taken for this visit.     Allergies   Allergen Reactions    Eszopiclone      Other reaction(s): ineffective  Other reaction(s): ineffective    Iodinated Contrast Media Other (See Comments)    Iohexol     Modafinil      Other reaction(s): Unknown  Other reaction(s): Unknown    Suvorexant      Other reaction(s): ineffective  Other reaction(s): ineffective    Temazepam      Other reaction(s): ineffective  Other reaction(s): ineffective    Trazodone Hcl      Other reaction(s): depression  Other reaction(s): depression    Zolpidem Other (See Comments)    Zolpidem Tartrate      Other reaction(s): forgetful    Armodafinil Nausea Only        Current Outpatient Medications   Medication Sig Dispense Refill    diazePAM (VALIUM) 5 MG tablet       gabapentin (NEURONTIN) 300 MG capsule       VYVANSE 70 MG capsule        No current facility-administered medications for this visit.      Patient Active Problem List   Diagnosis    Backache    Attention deficit disorder    Anxiety disorder    Adjustment disorder with mixed anxiety and depressed mood    Osteoarthritis of knee    Nephrolithiasis    Hyperlipidemia    Ganglion cyst    Insomnia    Thalassemia      Family History   Problem Relation Age of Onset    Cancer Mother        History reviewed. No pertinent surgical history.   History reviewed. No pertinent past medical history.     I have reviewed and agree with [place] and ROS and intake form in chart and the record furthermore I have reviewed prior medical record(s) regarding this patients care during this appointment.     Review of Systems:   Patient is a pleasant appearing individual, appropriately  dressed, well hydrated, well nourished, who is alert, appropriately oriented for age, and in no acute distress with a normal gait and normal affect who does not appear to be in any significant pain.     Physical Exam:  Right Knee -Decrease range of motion with flexion, Knee arc of greater than 50 degrees, Some crepitation, Grossly neurovascularly intact, Good cap refill, No skin lesion, Moderate swelling, some gross instability, Some quadriceps weakness, Kellgren and Lawrence at least grade 4    Left Knee - Full Range of Motion, No crepitation, Grossly neurovascularly intact, Good cap refill, No skin lesion, No swelling, No gross instability, No quadriceps weakness    Inpatient status: The patient has admitted to severe pain in the affected knee and due to such pain they are unable to complete activities of daily living at home and/or work on a regular basis where conservative treatments have failed. After extensive discussion with the patient, they have chosen to receive a total knee replacement with the expectation of  inpatient procedure. Their dependent functional status (i.e. lack of capable support and safety at home, pain management, comorbities, or difficulty ambulating with assistive walking devices) would deem them a candidate for an inpatient stay. The patient acknowledges and understand the plan.    The risks of surgery were explained to the patient which include but not limited to infection, nerve injury, artery injury, tendon injury, poor result, poor wound healing, unforeseen incidence, bleeding, infection, nerve damage, failure to improve, worsening of symptoms, morbidity, and mortality risks were explained. All questions were answered. Patient was told of no guarantees. Patient accepts all risks and benefits. A consent for surgery will be documented and signed by the patient or a legal guardian. All questions were answered. The procedure was explained in detail.     The patient was counseled about  the risks of contracting Covid-19 during their perioperative period and any recovery window from their procedure. The patient was made aware that contracting Covid-19 may worsen their prognosis for recovering from their procedure and lend to a higher morbidity and/or mortality risk. All material risks, benefits, and reasonable alternatives including postponing the procedure were discussed. The patient DOES wish to proceed with their procedure at this time.            HPI:  The patient is here with a chief complaint of right knee pain, throbbing burning pain, diagnosed with osteoarthritis (OA).      X-rays are positive for severe osteoarthritis (OA).      Assessment/Plan:  Plan will be for right total knee replacement.  We will do Dilaudid, Keflex, Zofran, aspirin and go from there.         As part of continued conservative pain management options the patient was advised to utilize Tylenol or OTC NSAIDS as long as it is not medically contraindicated.     Return to Office:    Follow-up and Dispositions    Return for ALREADY SCHEDULED FOR SURGERY.        Scribed by Alfred Levins, MA as dictated by [facility] A. Allena Katz, MD.  Documentation, performed by, True and Accepted Mae Cianci A. Allena Katz, MD

## 2022-02-01 ENCOUNTER — Encounter: Admit: 2022-02-01 | Discharge: 2022-02-01 | Payer: BLUE CROSS/BLUE SHIELD | Attending: Orthopaedic Surgery

## 2022-02-02 ENCOUNTER — Ambulatory Visit: Admit: 2022-02-02 | Discharge: 2022-02-02 | Payer: BLUE CROSS/BLUE SHIELD

## 2022-02-02 DIAGNOSIS — Z96651 Presence of right artificial knee joint: Secondary | ICD-10-CM

## 2022-02-02 NOTE — Progress Notes (Signed)
Name: Travis Montgomery    DOB: 07-Oct-1960        08/26/2021     1:51 PM   Ambulatory Bariatric Summary   Weight - Scale 240   Height 1.854 m (6\' 1" )   BMI 31.7 kg/m2   Weight - Scale 108.9 kg (240 lb)   BMI (Calculated) 31.7       There is no height or weight on file to calculate BMI.    Service Dept: [facility]  [address], [place] [address]  Dept: [phone]  Dept Fax: [phone]     Patient's Pharmacies:    [facility] [id number] - [place], [place] - [place] [phone] - F [phone]  [place] [address]  Phone: [phone] Fax: [place] [address] [place] [phone] [place]anda [place] [phone]  [place]  [place] [address]  Phone: [phone] Fax: [phone]       No chief complaint on file.      HPI:  Patient presents for postop care following right TKA. Surgery was on 02/01/2022.  Ambulating good with a cane. Pain is a 5/10. Pain is controlled with current analgesics.  Medication(s) being used: narcotic analgesics including hydromorphone (Dilaudid).     There were no vitals taken for this visit.   Allergies   Allergen Reactions    Eszopiclone      Other reaction(s): ineffective  Other reaction(s): ineffective    Iodinated Contrast Media Other (See Comments)    Iohexol     Modafinil      Other reaction(s): Unknown  Other reaction(s): Unknown    Suvorexant      Other reaction(s): ineffective  Other reaction(s): ineffective    Temazepam      Other reaction(s): ineffective  Other reaction(s): ineffective    Trazodone Hcl      Other reaction(s): depression  Other reaction(s): depression    Zolpidem Other (See Comments)    Zolpidem Tartrate      Other reaction(s): forgetful    Armodafinil Nausea Only      Current Outpatient Medications   Medication Sig Dispense Refill    cephALEXin (KEFLEX) 500 MG capsule  Take 1 capsule by mouth every 8 (eight) hours for 3 days Do not start medication until after surgery 9 capsule 0    ondansetron (ZOFRAN-ODT) 8 MG TBDP disintegrating tablet Place 1 tablet under the tongue every 8 hours as needed for Nausea or Vomiting Do Not start until after surgery 20 tablet 0    aspirin (BAYER ASPIRIN) 325 MG tablet Take 1 tablet by mouth in the morning and at bedtime DO NOT START UNTIL THE DAY AFTER SURGERY. Please do not discontinue without consulting your physician. 60 tablet 0    HYDROmorphone (DILAUDID) 4 MG tablet Take 1 tablet by mouth every 4-6 hours as needed for Pain for up to 8 days. DO NOT START PAIN MEDICATION UNTIL AFTER SURGERY Max Daily Amount: 24 mg 30 tablet 0    diazePAM (VALIUM) 5 MG tablet       gabapentin (NEURONTIN) 300 MG capsule       VYVANSE 70 MG capsule        No current facility-administered medications for this visit.      Patient Active Problem List   Diagnosis    Backache  Attention deficit disorder    Anxiety disorder    Adjustment disorder with mixed anxiety and depressed mood    Osteoarthritis of knee    Nephrolithiasis    Hyperlipidemia    Ganglion cyst    Insomnia    Thalassemia      Family History   Problem Relation Age of Onset    Cancer Mother       Social History     Socioeconomic History    Marital status: Married   Tobacco Use    Smoking status: Never    Smokeless tobacco: Never   Substance and Sexual Activity    Alcohol use: Yes      No past surgical history on file.   No past medical history on file.     I have reviewed and agree with PFSH and ROS and intake form in chart and the record furthermore I have reviewed prior medical record(s) regarding this patients care during this appointment.     Review of Systems:  Patient is a pleasant appearing individual, appropriately dressed, well hydrated, well nourished, who is alert, appropriately oriented for age, and in no acute distress with a normal gait and normal affect who does not appear to be in any  significant pain.     Physical Exam:  Left knee - Neurovascularly intact with good cap refill, full range of motion and full strength, well healed incision noted, no swelling, no erythema, no instability.     Right knee - Decrease range of motion with flexion, Some crepitation, Grossly neurovascularly intact, Good cap refill, No skin lesion, Moderate swelling, No gross instability, Some quadriceps weakness         Plan:  1. Continue PT.  2. Wound care/showering discussed.  3. Continue DVT prophylaxis as directed.  4. Pt is to increase activities as tolerated.  Follow up in 1 week virtually for the right knee and as needed or if symptoms worsen.    No diagnosis found.     As part of continued conservative pain management options the patient was advised to utilize Tylenol or OTC NSAIDS as long as it is not medically contraindicated.     Return to Office:      Scribed by Sharia Reeve, APRN - CNP as dictated by Kriss Perleberg A. Stephania Macfarlane, APRN, CNP.  Documentation, performed by, True and Accepted Akili Cuda A. Mariany Mackintosh, APRN, CNP

## 2022-02-02 NOTE — Patient Instructions (Signed)
Post Operative Total Knee Replacement Instructions    PLEASE REMOVE YOUR LONG WHITE BANDAGE & STOCKING PRIOR TO CONNECTING TO YOUR APPOINTMENT       During your recovery from a total knee replacement, you will be participating in an OUTPATIENT physical therapy program. Your goal is to progress from a walker to a cane to nothing at all while walking, if possible, over the next 2 weeks.     You can now shower and get your incision wet, pat it dry afterwards. No further dressing changes will be required as long as there is no drainage.  You may take a bath 3 weeks post surgery as long as there is no drainage from your incision.    You may drive if you are not using any assistive devices to walk and are not using any narcotic pain medication.     You may discontinue your aspirin (if that is your primary blood thinner prescribed by Dr. Patel ) when you are at least 4 weeks out from surgery and are no longer using a cane or walker.  If you are still using assistive devices, please DO NOT stop the aspirin until you are completely off them.  If you are on other blood thinners prescribed by another doctor please continue that until you are instructed to discontinue them.    You and your physical therapist will determine when to stop your physical therapy program.    Narcotic pain medication can cause constipation.  You may take over the counter stool softeners such as Docusate Sodium or Miralax 1-2 times per day to assist with the constipation.  Ensure you are taking in plenty of fluids and fiber as well.    If you require a refill on a narcotic pain medication, please let Dr. Patel or his Nurse Practitioner know at your appointment today or AT LEAST 48 hours prior to needing it. No refills will be provided after hours or during weekends.    If you experience any significant calf pain, swelling, or shortness of breath, please call our office immediately or go to the nearest emergency room.    If you notice any significant  increase in swelling of the knee or leg, redness around the incision site, drainage, pus, or any oozing fluid from the incision please notify our office immediately.      If you develop a fever greater than 101.5 or have any significant trauma or falls, please call and notify our office.  A low-grade temperature below 101 can be normal for the first few weeks.     Bruising & pain around your thigh, leg, & foot are normal for up to 6-8 weeks.     You may experience a clicking or clunking noise in your knee, this is normal you now have an artificial implant in place that can cause these noises.     Moving forward if you have any type of DENTAL WORK/CLEANINGS or any invasive procedures that involve a risk of bleeding we recommend that you pre medicate one hour prior to these procedures with antibiotics. Please let the performing provider know that you have a total knee implant in place so they may pre medicate you accordingly. Please contact our office at least 72 hours prior to needing these medications if they do not. Please do not resume any routine  dental cleanings until you are at least 6 weeks out from your surgery.     At any time if you feel like you have   stopped progressing in therapy or your knee feels worse, please call our office for an appointment to be seen.

## 2022-02-03 MED ORDER — CEPHALEXIN 500 MG PO CAPS
500 MG | ORAL_CAPSULE | Freq: Four times a day (QID) | ORAL | 0 refills | Status: AC
Start: 2022-02-03 — End: 2022-02-10

## 2022-02-03 NOTE — Therapy (Signed)
OUTPATIENT PHYSICAL THERAPY DISCHARGE NOTE   Patient Name: Bobby House MRN: [id number] DOB:25-Oct-1960, 61 y.o., male Today's Date: 02/04/2022  PCP: Marius Ditch, MD REFERRING PROVIDER: Derry Skill, MD  END OF SESSION:  12/10/2021 Visit: 9 Time in 10:18 am Time out: 11:06 am  Past Medical History:  Diagnosis Date   ADD (attention deficit disorder)    DDD (degenerative disc disease), lumbar    Hypertension    Prostatitis    Umbilical hernia    Past Surgical History:  Procedure Laterality Date   LUMBAR DISC SURGERY     Patient Active Problem List   Diagnosis Date Noted   Ganglion cyst Left post lat knee 05/28/2021   Primary osteoarthritis of left knee 05/28/2021   Anxiety state, unspecified 01/09/2013   Attention deficit disorder without mention of hyperactivity 01/09/2013   Adjustment disorder with mixed anxiety and depressed mood 09/19/2012    REFERRING DIAG: Y69.485 (ICD-10-CM) - Presence of left artificial knee joint   THERAPY DIAG:  Muscle weakness (generalized) - Plan: PT plan of care cert/re-cert  Difficulty in walking, not elsewhere classified - Plan: PT plan of care cert/re-cert  Stiffness of right knee, not elsewhere classified - Plan: PT plan of care cert/re-cert  Acute pain of right knee - Plan: PT plan of care cert/re-cert  Localized edema  Rationale for Evaluation and Treatment Rehabilitation  PERTINENT HISTORY: hx lumbar fusion  PRECAUTIONS: none  SUBJECTIVE: 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  Had right TKA on 02/01/22 using "jiffy" knee procedure. He states his pain is fairly controlled but experiences the post rest stiffness and difficulty with start up. He reports difficulty sleeping due to medications making him "wired". He is actively walking and going up/ down stairs.    PAIN:  Are you in pain: 5/10 at rest  Location: R knee, tingling, stiffness.  Aggravating factors:  Movement Easing factors: Rest and medication, ice.   OBJECTIVE:    DIAGNOSTIC FINDINGS: na   PATIENT SURVEYS:  FOTO: 44.03%, 74% in 17 visits.   COGNITION:           Overall cognitive status: Within functional limits for tasks assessed                          SENSATION: WFL   POSTURE:  Normal.      LOWER EXTREMITY ROM:   Active ROM Right eval Left eval  Knee flexion 90  113  Knee extension -2 (lacking) 0   (Blank rows = not tested)   LOWER EXTREMITY MMT:            Right LE: not tested today. Left knee generally 5/5.       FUNCTIONAL TESTS:  Initial eval: 5 times sit to stand:  15.68 sec  Timed up and go (TUG): 10.85 sec   GAIT: 12/29/21 Distance walked: 300 ft Assistive device utilized: None Level of assistance: Complete Independence Comments: antalgic   TODAY'S TREATMENT:   Reviewed S&S of infection, Game ready 10 min, mod compression.   PATIENT EDUCATION:  Education details: Initiated HEP Person educated: Patient Education method: Consulting civil engineer, Media planner, Verbal cues, and Handouts Education comprehension: verbalized understanding, returned demonstration, and verbal cues required     HOME EXERCISE PROGRAM: Access Code: [email or web address] URL: [email or web address] Previous exercises from L knee.     ASSESSMENT:   CLINICAL IMPRESSION: Patient is a 61 y.o. male who was seen today for physical therapy evaluation  and treatment for Right TKA using jiffy knee procedure. He presents with decreased ROM, strength and function along with elevated pain. He is ambulating with slightly antalgic gait but no assistive device. He states that when his pain level is up, he uses a cane to avoid excessive antalgic gait and compensation. 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 He plans to return to work in the new year. His goals are to be able to walk and hike without pain.     OBJECTIVE IMPAIRMENTS Abnormal gait, difficulty walking, decreased ROM,  decreased strength, increased muscle spasms, and pain.    ACTIVITY LIMITATIONS carrying, lifting, bending, sitting, standing, squatting, sleeping, stairs, transfers, and dressing   PARTICIPATION LIMITATIONS: meal prep, cleaning, laundry, driving, shopping, community activity, occupation, and yard work   PERSONAL FACTORS Fitness, Past/current experiences, and Profession are also affecting patient's functional outcome.    REHAB POTENTIAL: Excellent   CLINICAL DECISION MAKING: Stable/uncomplicated   EVALUATION COMPLEXITY: Low     GOALS: Goals reviewed with patient? Yes   SHORT TERM GOALS: Target date: 12/22/2021  Patient will be independent with initial HEP  Baseline: Goal status: NEW  2.  Pain report to be no greater than 4/10  Baseline:  Goal status: NEW   3.  Left knee flexion AROM to 100 degrees Baseline: 90 Goal status: NEW     LONG TERM GOALS: Target date: 01/19/2022    Patient to be independent with advanced HEP  Baseline:  Goal status: NEW   2.  Patient to report pain no greater than 2/10  Baseline:  Goal status: NEW   3.  Left knee flexion and extension to be within 5 degrees of uninvolved knee Baseline:  Goal status: NEW   4.  MMT to be 5/5 on left knee flexion and extension Baseline:  Goal status: NEW   5.  Patient to be able to ascend and descend steps with reciprocal gait without compensation Baseline:  Goal status: NEW   6.  Normalized gait with good heel to toe progression Baseline:  Goal status: NEW     PLAN: PT FREQUENCY: 3x/week   PT DURATION: 6 weeks   PLANNED INTERVENTIONS: Therapeutic exercises, Therapeutic activity, Neuromuscular re-education, Balance training, Gait training, Patient/Family education, Self Care, Joint mobilization, Stair training, DME instructions, Aquatic Therapy, Dry Needling, Electrical stimulation, Cryotherapy, Moist heat, Compression bandaging, Taping, Vasopneumatic device, Manual therapy, and Re-evaluation   PLAN  FOR NEXT SESSION: Review HEP, recumbent bike or Nustep for ROM,  progress quad rehab and functional strengthening  Royal Hawthorn PT, DPT 02/04/22  11:22 AM

## 2022-02-03 NOTE — Telephone Encounter (Signed)
From: Jaquann E Ellerman  To: Keionte Swicegood Annalies Viha Kriegel  Sent: 02/03/2022 1:58 PM EDT  Subject: Low Grade fever and hydromorphone    Alleigh Mollica, I had a fever 100.7 last night and it went down to 99.4 and now it is a 99.9. I have been doing my exercises. I also had to stop the hydromorphone it made me extremely nauseous. I'll be finished with my antibiotics today.

## 2022-02-04 ENCOUNTER — Encounter: Payer: Self-pay | Admitting: Physical Therapy

## 2022-02-04 ENCOUNTER — Ambulatory Visit: Payer: BC Managed Care – PPO | Attending: Orthopedic Surgery | Admitting: Physical Therapy

## 2022-02-04 ENCOUNTER — Telehealth

## 2022-02-04 DIAGNOSIS — M6281 Muscle weakness (generalized): Secondary | ICD-10-CM | POA: Diagnosis not present

## 2022-02-04 DIAGNOSIS — M25661 Stiffness of right knee, not elsewhere classified: Secondary | ICD-10-CM | POA: Insufficient documentation

## 2022-02-04 DIAGNOSIS — Z96651 Presence of right artificial knee joint: Secondary | ICD-10-CM | POA: Diagnosis present

## 2022-02-04 DIAGNOSIS — R262 Difficulty in walking, not elsewhere classified: Secondary | ICD-10-CM | POA: Insufficient documentation

## 2022-02-04 DIAGNOSIS — R600 Localized edema: Secondary | ICD-10-CM

## 2022-02-04 DIAGNOSIS — M25561 Pain in right knee: Secondary | ICD-10-CM | POA: Diagnosis not present

## 2022-02-04 NOTE — Telephone Encounter (Signed)
Patient is requesting a refill on pain medication.     Will be out come Monday     TKR 02/01/2022    [facility] [id number], [place]

## 2022-02-07 ENCOUNTER — Encounter: Payer: Self-pay | Admitting: Physical Therapy

## 2022-02-07 ENCOUNTER — Ambulatory Visit: Payer: BC Managed Care – PPO | Admitting: Physical Therapy

## 2022-02-07 ENCOUNTER — Telehealth

## 2022-02-07 DIAGNOSIS — M25561 Pain in right knee: Secondary | ICD-10-CM

## 2022-02-07 DIAGNOSIS — M6281 Muscle weakness (generalized): Secondary | ICD-10-CM

## 2022-02-07 DIAGNOSIS — R600 Localized edema: Secondary | ICD-10-CM

## 2022-02-07 DIAGNOSIS — R262 Difficulty in walking, not elsewhere classified: Secondary | ICD-10-CM

## 2022-02-07 DIAGNOSIS — Z96651 Presence of right artificial knee joint: Secondary | ICD-10-CM | POA: Diagnosis not present

## 2022-02-07 DIAGNOSIS — M25661 Stiffness of right knee, not elsewhere classified: Secondary | ICD-10-CM

## 2022-02-07 MED ORDER — HYDROCODONE-ACETAMINOPHEN 5-325 MG PO TABS
5-325 MG | ORAL_TABLET | Freq: Four times a day (QID) | ORAL | 0 refills | Status: AC | PRN
Start: 2022-02-07 — End: 2022-02-14

## 2022-02-07 MED ORDER — NALOXONE HCL 4 MG/0.1ML NA LIQD
4 | NASAL | 5 refills | Status: AC | PRN
Start: 2022-02-07 — End: ?

## 2022-02-07 MED ORDER — HYDROMORPHONE HCL 4 MG PO TABS
4 | ORAL_TABLET | ORAL | 0 refills | Status: DC | PRN
Start: 2022-02-07 — End: 2022-02-07

## 2022-02-07 NOTE — Therapy (Signed)
OUTPATIENT PHYSICAL THERAPY TREATMENT NOTE   Patient Name: Bobby House MRN: [id number] DOB:08-Nov-1960, 61 y.o., male Today's Date: 02/07/2022  PCP: Marius Ditch, MD REFERRING PROVIDER: Derry Skill, MD  END OF SESSION:   PT End of Session - 02/07/22 1230     Visit Number 2    Number of Visits 24    Date for PT Re-Evaluation 04/08/22    Authorization Type BCBS    PT Start Time 1230    PT Stop Time 1308    PT Time Calculation (min) 38 min    Activity Tolerance Patient tolerated treatment well    Behavior During Therapy WFL for tasks assessed/performed             Past Medical History:  Diagnosis Date   ADD (attention deficit disorder)    DDD (degenerative disc disease), lumbar    Hypertension    Prostatitis    Umbilical hernia    Past Surgical History:  Procedure Laterality Date   LUMBAR DISC SURGERY     Patient Active Problem List   Diagnosis Date Noted   Ganglion cyst Left post lat knee 05/28/2021   Primary osteoarthritis of left knee 05/28/2021   Anxiety state, unspecified 01/09/2013   Attention deficit disorder without mention of hyperactivity 01/09/2013   Adjustment disorder with mixed anxiety and depressed mood 09/19/2012    REFERRING DIAG: V67.209 (ICD-10-CM) - Presence of left artificial knee joint   THERAPY DIAG:  Muscle weakness (generalized)  Difficulty in walking, not elsewhere classified  Stiffness of right knee, not elsewhere classified  Acute pain of right knee  Localized edema  Rationale for Evaluation and Treatment Rehabilitation  PERTINENT HISTORY: hx lumbar fusion    PRECAUTIONS: None  SUBJECTIVE:                                                                                                                                                                                      SUBJECTIVE STATEMENT:  Knee swollen and painful . Took dog for walk last night and boy was I sore.    PAIN:  Are you having pain? Yes:  NPRS scale: 5/10 Pain location: Rt knee Pain description: Sore Aggravating factors: Overdoing it Relieving factors: ice   OBJECTIVE: (objective measures completed at initial evaluation unless otherwise dated)  DIAGNOSTIC FINDINGS: na   PATIENT SURVEYS:  FOTO: 44.03%, 74% in 17 visits.    COGNITION:           Overall cognitive status: Within functional limits for tasks assessed  SENSATION: WFL   POSTURE:  Normal.      LOWER EXTREMITY ROM:   Active ROM Right eval Left eval Right 02/07/22  Knee flexion 90  113 100  Knee extension -2 (lacking) 0    (Blank rows = not tested)   LOWER EXTREMITY MMT:            Right LE: not tested today. Left knee generally 5/5.       FUNCTIONAL TESTS:  Initial eval: 5 times sit to stand:  15.68 sec  Timed up and go (TUG): 10.85 sec   GAIT: 12/29/21 Distance walked: 300 ft Assistive device utilized: None Level of assistance: Complete Independence Comments: antalgic   TODAY'S TREATMENT:   02/07/22: Seated heel slides with slider 20x QS with SLR 2x10 Sidelying hip abd 2x10 hands on glute medius  Sidelying adduction  2x10  Nustep L1 x 6 min for motion and gentle quad activation Game Ready 15 min 3 flakes medium compression in elevation               Reviewed S&S of infection, Game ready 10 min, mod compression.    PATIENT EDUCATION:  Education details: Initiated HEP Person educated: Patient Education method: Consulting civil engineer, Media planner, Verbal cues, and Handouts Education comprehension: verbalized understanding, returned demonstration, and verbal cues required     HOME EXERCISE PROGRAM: Access Code: [email or web address] URL: [email or web address] Previous exercises from L knee.      ASSESSMENT:   CLINICAL IMPRESSION: Pt presents with moderate bruising and swelling. Pt could tolerate AROM for knee flexion well, SLRs were difficult. Flexion iimproving.   OBJECTIVE IMPAIRMENTS Abnormal gait,  difficulty walking, decreased ROM, decreased strength, increased muscle spasms, and pain.    ACTIVITY LIMITATIONS carrying, lifting, bending, sitting, standing, squatting, sleeping, stairs, transfers, and dressing   PARTICIPATION LIMITATIONS: meal prep, cleaning, laundry, driving, shopping, community activity, occupation, and yard work   PERSONAL FACTORS Fitness, Past/current experiences, and Profession are also affecting patient's functional outcome.    REHAB POTENTIAL: Excellent   CLINICAL DECISION MAKING: Stable/uncomplicated   EVALUATION COMPLEXITY: Low     GOALS: Goals reviewed with patient? Yes   SHORT TERM GOALS: Target date: 12/22/2021  Patient will be independent with initial HEP  Baseline: Goal status: Goal met 02/07/22   2.  Pain report to be no greater than 4/10  Baseline:  Goal status: NEW   3.  Left knee flexion AROM to 100 degrees Baseline: 90 Goal status: NEW     LONG TERM GOALS: Target date: 01/19/2022    Patient to be independent with advanced HEP  Baseline:  Goal status: NEW   2.  Patient to report pain no greater than 2/10  Baseline:  Goal status: NEW   3.  Left knee flexion and extension to be within 5 degrees of uninvolved knee Baseline:  Goal status: NEW   4.  MMT to be 5/5 on left knee flexion and extension Baseline:  Goal status: NEW   5.  Patient to be able to ascend and descend steps with reciprocal gait without compensation Baseline:  Goal status: NEW   6.  Normalized gait with good heel to toe progression Baseline:  Goal status: NEW     PLAN: PT FREQUENCY: 3x/week   PT DURATION: 6 weeks   PLANNED INTERVENTIONS: Therapeutic exercises, Therapeutic activity, Neuromuscular re-education, Balance training, Gait training, Patient/Family education, Self Care, Joint mobilization, Stair training, DME instructions, Aquatic Therapy, Dry Needling, Electrical stimulation, Cryotherapy, Moist heat, Compression bandaging, Taping, Vasopneumatic  device, Manual therapy,  and Re-evaluation   PLAN FOR NEXT SESSION: recumbent bike or Nustep for ROM,  progress quad rehab and functional strengthening    Lorrayne Ismael, PTA 02/07/2022, 12:59 PM

## 2022-02-07 NOTE — Unmapped (Signed)
Formatting of this note is different from the original.  Images from the original note were not included.    OUTPATIENT PHYSICAL THERAPY TREATMENT NOTE    Patient Name: Travis Montgomery  MRN: [id number]  DOB:1961-02-09, 61 y.o., male  Today's Date: 02/07/2022    PCP: Marius Ditch, MD  REFERRING PROVIDER: Derry Skill, MD    END OF SESSION:    PT End of Session - 02/07/22 1230       Visit Number 2     Number of Visits 24     Date for PT Re-Evaluation 04/08/22     Authorization Type BCBS     PT Start Time 1230     PT Stop Time 1308     PT Time Calculation (min) 38 min     Activity Tolerance Patient tolerated treatment well     Behavior During Therapy WFL for tasks assessed/performed                Past Medical History:   Diagnosis Date    ADD (attention deficit disorder)     DDD (degenerative disc disease), lumbar     Hypertension     Prostatitis     Umbilical hernia      Past Surgical History:   Procedure Laterality Date    LUMBAR DISC SURGERY       Patient Active Problem List    Diagnosis Date Noted    Ganglion cyst Left post lat knee 05/28/2021    Primary osteoarthritis of left knee 05/28/2021    Anxiety state, unspecified 01/09/2013    Attention deficit disorder without mention of hyperactivity 01/09/2013    Adjustment disorder with mixed anxiety and depressed mood 09/19/2012     REFERRING DIAG: I94.854 (ICD-10-CM) - Presence of left artificial knee joint     THERAPY DIAG:   Muscle weakness (generalized)    Difficulty in walking, not elsewhere classified    Stiffness of right knee, not elsewhere classified    Acute pain of right knee    Localized edema    Rationale for Evaluation and Treatment Rehabilitation    PERTINENT HISTORY: hx lumbar fusion      PRECAUTIONS: None    SUBJECTIVE:                                                                                                                                                                                        SUBJECTIVE STATEMENT:  Knee swollen and  painful . Took dog for walk last night and boy was I sore.     PAIN:   Are you having pain? Yes: NPRS scale: 5/10  Pain  location: Rt knee  Pain description: Sore  Aggravating factors: Overdoing it  Relieving factors: ice    OBJECTIVE: (objective measures completed at initial evaluation unless otherwise dated)    DIAGNOSTIC FINDINGS: na    PATIENT SURVEYS:   FOTO: 44.03%, 74% in 17 visits.     COGNITION:            Overall cognitive status: Within functional limits for tasks assessed                  SENSATION:  WFL    POSTURE:  Normal.       LOWER EXTREMITY ROM:    Active ROM Right  eval Left  eval Right  02/07/22   Knee flexion 90  113 100   Knee extension -2 (lacking) 0     (Blank rows = not tested)    LOWER EXTREMITY MMT:             Right LE: not tested today. Left knee generally 5/5.        FUNCTIONAL TESTS:   Initial eval:  5 times sit to stand:  15.68 sec   Timed up and go (TUG): 10.85 sec    GAIT: 12/29/21  Distance walked: 300 ft  Assistive device utilized: None  Level of assistance: Complete Independence  Comments: antalgic    TODAY'S TREATMENT:     02/07/22:  Seated heel slides with slider 20x  QS with SLR 2x10  Sidelying hip abd 2x10 hands on glute medius   Sidelying adduction  2x10   Nustep L1 x 6 min for motion and gentle quad activation  Game Ready 15 min 3 flakes medium compression in elevation                Reviewed S&S of infection, Game ready 10 min, mod compression.     PATIENT EDUCATION:   Education details: Initiated HEP  Person educated: Patient  Education method: Consulting civil engineer, Media planner, Verbal cues, and Handouts  Education comprehension: verbalized understanding, returned demonstration, and verbal cues required      HOME EXERCISE PROGRAM:  Access Code: [email or web address]  URL: [email or web address]  Previous exercises from L knee.       ASSESSMENT:    CLINICAL IMPRESSION:  Pt presents with moderate bruising and swelling. Pt could tolerate AROM for knee flexion well, SLRs were  difficult. Flexion iimproving.    OBJECTIVE IMPAIRMENTS Abnormal gait, difficulty walking, decreased ROM, decreased strength, increased muscle spasms, and pain.     ACTIVITY LIMITATIONS carrying, lifting, bending, sitting, standing, squatting, sleeping, stairs, transfers, and dressing    PARTICIPATION LIMITATIONS: meal prep, cleaning, laundry, driving, shopping, community activity, occupation, and yard work    PERSONAL FACTORS Fitness, Past/current experiences, and Profession are also affecting patient's functional outcome.     REHAB POTENTIAL: Excellent    CLINICAL DECISION MAKING: Stable/uncomplicated    EVALUATION COMPLEXITY: Low      GOALS:  Goals reviewed with patient? Yes    SHORT TERM GOALS: Target date: 12/22/2021   Patient will be independent with initial HEP   Baseline:  Goal status: Goal met 02/07/22    2.  Pain report to be no greater than 4/10   Baseline:   Goal status: NEW    3.  Left knee flexion AROM to 100 degrees  Baseline: 90  Goal status: NEW      LONG TERM GOALS: Target date: 01/19/2022     Patient to be independent with advanced HEP   Baseline:   Goal  status: NEW    2.  Patient to report pain no greater than 2/10   Baseline:   Goal status: NEW    3.  Left knee flexion and extension to be within 5 degrees of uninvolved knee  Baseline:   Goal status: NEW    4.  MMT to be 5/5 on left knee flexion and extension  Baseline:   Goal status: NEW    5.  Patient to be able to ascend and descend steps with reciprocal gait without compensation  Baseline:   Goal status: NEW    6.  Normalized gait with good heel to toe progression  Baseline:   Goal status: NEW      PLAN:  PT FREQUENCY: 3x/week    PT DURATION: 6 weeks    PLANNED INTERVENTIONS: Therapeutic exercises, Therapeutic activity, Neuromuscular re-education, Balance training, Gait training, Patient/Family education, Self Care, Joint mobilization, Stair training, DME instructions, Aquatic Therapy, Dry Needling, Electrical stimulation, Cryotherapy, Moist  heat, Compression bandaging, Taping, Vasopneumatic device, Manual therapy, and Re-evaluation    PLAN FOR NEXT SESSION: recumbent bike or Nustep for ROM,  progress quad rehab and functional strengthening    COCHRAN,JENNIFER, PTA  02/07/2022, 12:59 PM      Electronically signed by Altamese Dilling, PTA at 02/07/2022 12:59 PM EDT

## 2022-02-07 NOTE — Telephone Encounter (Signed)
Patient was requesting for Oxycodone. Not dilaudid. I should have specified that. Could you please cancel the Dilaudid and send that in instead?    Thank you     [facility] [id number], [place] - F [phone]

## 2022-02-08 ENCOUNTER — Telehealth: Admit: 2022-02-08 | Discharge: 2022-02-08 | Payer: BLUE CROSS/BLUE SHIELD

## 2022-02-08 DIAGNOSIS — Z96651 Presence of right artificial knee joint: Secondary | ICD-10-CM

## 2022-02-08 NOTE — Progress Notes (Signed)
Travis Montgomery (DOB:  Dec 02, 1960) is a Established patient, evaluated via telephone on 02/08/2022    [facility] [place] [facility]  [place] [facility]  [address] Maurie Boettcher  [place] [address]  Dept: [phone]  Dept Fax: [phone]   No chief complaint on file.    Patient-Reported Vitals  No data recorded   Allergies   Allergen Reactions    Eszopiclone      Other reaction(s): ineffective  Other reaction(s): ineffective    Iodinated Contrast Media Other (See Comments)    Iohexol     Modafinil      Other reaction(s): Unknown  Other reaction(s): Unknown    Suvorexant      Other reaction(s): ineffective  Other reaction(s): ineffective    Temazepam      Other reaction(s): ineffective  Other reaction(s): ineffective    Trazodone Hcl      Other reaction(s): depression  Other reaction(s): depression    Zolpidem Other (See Comments)    Zolpidem Tartrate      Other reaction(s): forgetful    Armodafinil Nausea Only     Current Outpatient Medications   Medication Sig Dispense Refill    naloxone 4 MG/0.1ML LIQD nasal spray 1 spray by Nasal route as needed for Opioid Reversal 1 each 5    HYDROcodone-acetaminophen (NORCO) 5-325 MG per tablet Take 1 tablet by mouth every 6 hours as needed for Pain for up to 7 days. Intended supply: 7 days. Take lowest dose possible to manage pain Max Daily Amount: 4 tablets 28 tablet 0    cephALEXin (KEFLEX) 500 MG capsule Take 1 capsule by mouth 4 times daily for 7 days 28 capsule 0    aspirin (BAYER ASPIRIN) 325 MG tablet Take 1 tablet by mouth in the morning and at bedtime DO NOT START UNTIL THE DAY AFTER SURGERY. Please do not discontinue without consulting your physician. 60 tablet 0    diazePAM (VALIUM) 5 MG tablet       gabapentin (NEURONTIN) 300 MG capsule       VYVANSE 70 MG capsule        No current facility-administered medications for this visit.      Patient Active Problem List   Diagnosis    Backache    Attention deficit  disorder    Anxiety disorder    Adjustment disorder with mixed anxiety and depressed mood    Osteoarthritis of knee    Nephrolithiasis    Hyperlipidemia    Ganglion cyst    Insomnia    Thalassemia      Family History   Problem Relation Age of Onset    Cancer Mother       Social History     Socioeconomic History    Marital status: Married   Tobacco Use    Smoking status: Never    Smokeless tobacco: Never   Substance and Sexual Activity    Alcohol use: Yes      No past surgical history on file.   No past medical history on file.     I have reviewed and agree with PFSH and ROS and intake form in chart and the record furthermore I have reviewed prior medical record(s) regarding this patients care during this appointment.   Subjective:      Patient presents for postop care following right TKA. Surgery was on 02/01/2022. Ambulating good without a walker. Pain is a 5/10. Pain is controlled with current analgesics.  Medication(s) being used: narcotic analgesics including hydrocodone/acetaminophen (Lorcet, Lortab, Norco, Vicodin).    Objective:     There were no vitals taken for this visit.    General:  alert, cooperative, no distress, appears stated age   ROM: Left knee - Neurovascularly intact with good cap refill, full range of motion and full strength, well healed incision noted, no swelling, no erythema, no instability.     Right knee - Decrease range of motion with flexion, Some crepitation, Grossly neurovascularly intact, Good cap refill, No skin lesion, Moderate swelling, No gross instability, Some quadriceps weakness       Incision:   healing well, no drainage, no erythema, incision well approximated, moderate swelling and bruising     Assessment:     Doing well postoperatively.    Plan:     1. Continue PT.  2. Wound care/showering discussed.  3. Continue DVT prophylaxis as directed.  4. Pt is to increase activities as tolerated.  Follow up as needed or if symptoms worsen for the right knee.      As part of  continued conservative pain management options the patient was advised to utilize Tylenol or OTC NSAIDS as long as it is not medically contraindicated.   Total Time: minutes: 5-10 minutes  Elbridge E Mizner was evaluated through a synchronous (real-time) audio encounter. Patient identification was verified at the start of the visit. He (or guardian if applicable) is aware that this is a billable service, which includes applicable co-pays. This visit was conducted with the patient's (and/or legal guardian's) verbal consent. He has not had a related appointment within my department in the past 7 days or scheduled within the next 24 hours.   The patient was located at Home: [address]  [place] [address].  The provider was located at [employer]): [address], [place] [address].    Note: not billable if this call serves to triage the patient into an appointment for the relevant concern    - [employer] A. Kirke Breach, APRN, CNP

## 2022-02-08 NOTE — Patient Instructions (Signed)
Post Operative Total Knee Replacement Instructions    PLEASE REMOVE YOUR LONG WHITE BANDAGE & STOCKING PRIOR TO CONNECTING TO YOUR APPOINTMENT       During your recovery from a total knee replacement, you will be participating in an OUTPATIENT physical therapy program. Your goal is to progress from a walker to a cane to nothing at all while walking, if possible, over the next 2 weeks.     You can now shower and get your incision wet, pat it dry afterwards. No further dressing changes will be required as long as there is no drainage.  You may take a bath 3 weeks post surgery as long as there is no drainage from your incision.    You may drive if you are not using any assistive devices to walk and are not using any narcotic pain medication.     You may discontinue your aspirin (if that is your primary blood thinner prescribed by Dr. Patel ) when you are at least 4 weeks out from surgery and are no longer using a cane or walker.  If you are still using assistive devices, please DO NOT stop the aspirin until you are completely off them.  If you are on other blood thinners prescribed by another doctor please continue that until you are instructed to discontinue them.    You and your physical therapist will determine when to stop your physical therapy program.    Narcotic pain medication can cause constipation.  You may take over the counter stool softeners such as Docusate Sodium or Miralax 1-2 times per day to assist with the constipation.  Ensure you are taking in plenty of fluids and fiber as well.    If you require a refill on a narcotic pain medication, please let Dr. Patel or his Nurse Practitioner know at your appointment today or AT LEAST 48 hours prior to needing it. No refills will be provided after hours or during weekends.    If you experience any significant calf pain, swelling, or shortness of breath, please call our office immediately or go to the nearest emergency room.    If you notice any significant  increase in swelling of the knee or leg, redness around the incision site, drainage, pus, or any oozing fluid from the incision please notify our office immediately.      If you develop a fever greater than 101.5 or have any significant trauma or falls, please call and notify our office.  A low-grade temperature below 101 can be normal for the first few weeks.     Bruising & pain around your thigh, leg, & foot are normal for up to 6-8 weeks.     You may experience a clicking or clunking noise in your knee, this is normal you now have an artificial implant in place that can cause these noises.     Moving forward if you have any type of DENTAL WORK/CLEANINGS or any invasive procedures that involve a risk of bleeding we recommend that you pre medicate one hour prior to these procedures with antibiotics. Please let the performing provider know that you have a total knee implant in place so they may pre medicate you accordingly. Please contact our office at least 72 hours prior to needing these medications if they do not. Please do not resume any routine  dental cleanings until you are at least 6 weeks out from your surgery.     At any time if you feel like you have   stopped progressing in therapy or your knee feels worse, please call our office for an appointment to be seen.

## 2022-02-09 ENCOUNTER — Ambulatory Visit: Payer: BC Managed Care – PPO

## 2022-02-09 DIAGNOSIS — R262 Difficulty in walking, not elsewhere classified: Secondary | ICD-10-CM

## 2022-02-09 DIAGNOSIS — M25661 Stiffness of right knee, not elsewhere classified: Secondary | ICD-10-CM

## 2022-02-09 DIAGNOSIS — Z96651 Presence of right artificial knee joint: Secondary | ICD-10-CM | POA: Diagnosis not present

## 2022-02-09 DIAGNOSIS — M25561 Pain in right knee: Secondary | ICD-10-CM

## 2022-02-09 DIAGNOSIS — M6281 Muscle weakness (generalized): Secondary | ICD-10-CM

## 2022-02-09 NOTE — Therapy (Signed)
OUTPATIENT PHYSICAL THERAPY TREATMENT NOTE   Patient Name: Bobby House MRN: [id number] DOB:Jul 15, 1960, 61 y.o., male Today's Date: 02/09/2022  PCP: Marius Ditch, MD REFERRING PROVIDER: Derry Skill, MD  END OF SESSION:   PT End of Session - 02/09/22 1211     Visit Number 3    Number of Visits 24    Date for PT Re-Evaluation 04/08/22    Authorization Type BCBS    PT Start Time 1152    PT Stop Time 1230    PT Time Calculation (min) 38 min    Activity Tolerance Patient tolerated treatment well    Behavior During Therapy WFL for tasks assessed/performed             Past Medical History:  Diagnosis Date   ADD (attention deficit disorder)    DDD (degenerative disc disease), lumbar    Hypertension    Prostatitis    Umbilical hernia    Past Surgical History:  Procedure Laterality Date   LUMBAR DISC SURGERY     Patient Active Problem List   Diagnosis Date Noted   Ganglion cyst Left post lat knee 05/28/2021   Primary osteoarthritis of left knee 05/28/2021   Anxiety state, unspecified 01/09/2013   Attention deficit disorder without mention of hyperactivity 01/09/2013   Adjustment disorder with mixed anxiety and depressed mood 09/19/2012    REFERRING DIAG: O53.664 (ICD-10-CM) - Presence of left artificial knee joint   THERAPY DIAG:  Muscle weakness (generalized)  Difficulty in walking, not elsewhere classified  Stiffness of right knee, not elsewhere classified  Acute pain of right knee  Rationale for Evaluation and Treatment Rehabilitation  PERTINENT HISTORY: hx lumbar fusion    PRECAUTIONS: None  SUBJECTIVE:                                                                                                                                                                                      SUBJECTIVE STATEMENT:  Knee swollen and painful . Took dog for walk last night and boy was I sore.    PAIN:  Are you having pain? Yes: NPRS scale: 5/10 Pain  location: Rt knee Pain description: Sore Aggravating factors: Overdoing it Relieving factors: ice   OBJECTIVE: (objective measures completed at initial evaluation unless otherwise dated)  DIAGNOSTIC FINDINGS: na   PATIENT SURVEYS:  FOTO: 44.03%, 74% in 17 visits.    COGNITION:           Overall cognitive status: Within functional limits for tasks assessed                          SENSATION: [facility]  POSTURE:  Normal.      LOWER EXTREMITY ROM:   Active ROM Right eval Left eval Right 02/07/22  Knee flexion 90  113 100  Knee extension -2 (lacking) 0    (Blank rows = not tested)   LOWER EXTREMITY MMT:            Right LE: not tested today. Left knee generally 5/5.       FUNCTIONAL TESTS:  Initial eval: 5 times sit to stand:  15.68 sec  Timed up and go (TUG): 10.85 sec   GAIT: 12/29/21 Distance walked: 300 ft Assistive device utilized: None Level of assistance: Complete Independence Comments: antalgic   TODAY'S TREATMENT:  02/09/22: Nustep x 5 min level 3 (PT present to discuss status and progress) Supine quad set x 20 right Supine TKE x 20 right Supine SAQ x 20 right  Supine SLR x 20 right  Seated heel slides x 10 Sit to stand x 10 with no need for verbal cues for alignment  Discussed continuing ice and protocol.  Suggested need for new footwear as his alignment has been corrected from a quite varus position bilaterally to proper alignment and that he will want to invest in new shoes to reduce patellofemoral pain as his tissues adjust throughout the kinetic chain. Game Ready 15 min 3 flakes medium compression in elevation  02/07/22: Seated heel slides with slider 20x QS with SLR 2x10 Sidelying hip abd 2x10 hands on glute medius  Sidelying adduction  2x10  Nustep L1 x 6 min for motion and gentle quad activation Game Ready 15 min 3 flakes medium compression in elevation               Reviewed S&S of infection, Game ready 10 min, mod compression.    PATIENT  EDUCATION:  Education details: Initiated HEP Person educated: Patient Education method: Consulting civil engineer, Media planner, Verbal cues, and Handouts Education comprehension: verbalized understanding, returned demonstration, and verbal cues required     HOME EXERCISE PROGRAM: Access Code: [email or web address] URL: [email or web address] Previous exercises from L knee.      ASSESSMENT:   CLINICAL IMPRESSION: Birdena Jubilee is progressing very well as expected.  He has good quad activity at end range.  He has approx 90-100 flexion and full extension.  Swelling is moderate.  Incision healing well.  He ambulates without a.d.  with minor antalgic gait.  He is managing pain fairly well but admits that the 5 mg oxycodone doesn't quite bring the pain down below threshold and he has to use some Tylenol or Dilaudid with the 5 mg oxy to control the pain.     OBJECTIVE IMPAIRMENTS Abnormal gait, difficulty walking, decreased ROM, decreased strength, increased muscle spasms, and pain.    ACTIVITY LIMITATIONS carrying, lifting, bending, sitting, standing, squatting, sleeping, stairs, transfers, and dressing   PARTICIPATION LIMITATIONS: meal prep, cleaning, laundry, driving, shopping, community activity, occupation, and yard work   PERSONAL FACTORS Fitness, Past/current experiences, and Profession are also affecting patient's functional outcome.    REHAB POTENTIAL: Excellent   CLINICAL DECISION MAKING: Stable/uncomplicated   EVALUATION COMPLEXITY: Low     GOALS: Goals reviewed with patient? Yes   SHORT TERM GOALS: Target date: 12/22/2021  Patient will be independent with initial HEP  Baseline: Goal status: Goal met 02/07/22   2.  Pain report to be no greater than 4/10  Baseline:  Goal status: NEW   3.  Left knee flexion AROM to 100 degrees Baseline: 90 Goal status: NEW     LONG  TERM GOALS: Target date: 01/19/2022    Patient to be independent with advanced HEP  Baseline:  Goal status: NEW   2.   Patient to report pain no greater than 2/10  Baseline:  Goal status: NEW   3.  Left knee flexion and extension to be within 5 degrees of uninvolved knee Baseline:  Goal status: NEW   4.  MMT to be 5/5 on left knee flexion and extension Baseline:  Goal status: NEW   5.  Patient to be able to ascend and descend steps with reciprocal gait without compensation Baseline:  Goal status: NEW   6.  Normalized gait with good heel to toe progression Baseline:  Goal status: NEW     PLAN: PT FREQUENCY: 3x/week   PT DURATION: 6 weeks   PLANNED INTERVENTIONS: Therapeutic exercises, Therapeutic activity, Neuromuscular re-education, Balance training, Gait training, Patient/Family education, Self Care, Joint mobilization, Stair training, DME instructions, Aquatic Therapy, Dry Needling, Electrical stimulation, Cryotherapy, Moist heat, Compression bandaging, Taping, Vasopneumatic device, Manual therapy, and Re-evaluation   PLAN FOR NEXT SESSION: recumbent bike or Nustep for ROM,  progress quad rehab and functional strengthening    Nardos Putnam B. Swade Shonka, PT 02/09/22 2:32 PM  [place] [address], [place], [place] [address] Phone # [phone] Fax [phone]

## 2022-02-10 NOTE — Therapy (Signed)
OUTPATIENT PHYSICAL THERAPY TREATMENT NOTE   Patient Name: Bobby House MRN: [id number] DOB:[birth year], 61 y.o., male Today's Date: 02/11/2022  PCP: Marius Ditch, MD REFERRING PROVIDER: Derry Skill, MD  END OF SESSION:   PT End of Session - 02/11/22 1055     Visit Number 4    Number of Visits 24    Date for PT Re-Evaluation 04/08/22    Authorization Type BCBS    PT Start Time 1018    PT Stop Time 1100    PT Time Calculation (min) 42 min    Activity Tolerance Patient tolerated treatment well    Behavior During Therapy WFL for tasks assessed/performed              Past Medical History:  Diagnosis Date   ADD (attention deficit disorder)    DDD (degenerative disc disease), lumbar    Hypertension    Prostatitis    Umbilical hernia    Past Surgical History:  Procedure Laterality Date   LUMBAR DISC SURGERY     Patient Active Problem List   Diagnosis Date Noted   Ganglion cyst Left post lat knee 05/28/2021   Primary osteoarthritis of left knee 05/28/2021   Anxiety state, unspecified 01/09/2013   Attention deficit disorder without mention of hyperactivity 01/09/2013   Adjustment disorder with mixed anxiety and depressed mood 09/19/2012    REFERRING DIAG: R48.546 (ICD-10-CM) - Presence of left artificial knee joint   THERAPY DIAG:  Muscle weakness (generalized)  Difficulty in walking, not elsewhere classified  Stiffness of right knee, not elsewhere classified  Acute pain of right knee  Localized edema  Rationale for Evaluation and Treatment Rehabilitation  PERTINENT HISTORY: hx lumbar fusion    PRECAUTIONS: None  SUBJECTIVE:                                                                                                                                                                                      SUBJECTIVE STATEMENT:  Pt states that he has a hard time sleeping at night. Otherwise he is doing well.    PAIN:  Are you having pain?  Yes: NPRS scale: 5/10 Pain location: Rt knee Pain description: Sore Aggravating factors: Overdoing it Relieving factors: ice   OBJECTIVE: (objective measures completed at initial evaluation unless otherwise dated)  DIAGNOSTIC FINDINGS: na   PATIENT SURVEYS:  FOTO: 44.03%, 74% in 17 visits.    COGNITION:           Overall cognitive status: Within functional limits for tasks assessed  SENSATION: WFL   POSTURE:  Normal.      LOWER EXTREMITY ROM:   Active ROM Right eval Left eval Right 02/07/22  Knee flexion 90  113 100  Knee extension -2 (lacking) 0    (Blank rows = not tested)   LOWER EXTREMITY MMT:            Right LE: not tested today. Left knee generally 5/5.       FUNCTIONAL TESTS:  Initial eval: 5 times sit to stand:  15.68 sec  Timed up and go (TUG): 10.85 sec   GAIT: 12/29/21 Distance walked: 300 ft Assistive device utilized: None Level of assistance: Complete Independence Comments: antalgic   TODAY'S TREATMENT:  02/09/22: Recumbent bike with full revolutions(PT present to discuss status and progress) Supine quad set x 20 right Supine TKE x 20 right Supine SAQ x 20 right  Supine SLR x 20 right  Seated heel slides x 10 Sit to stand x 10 with no need for verbal cues for alignment  Short axis distraction with OP into flexion Clean residue of glue, per request. Game Ready 10 min 3 flakes medium compression in elevation   02/07/22: Seated heel slides with slider 20x QS with SLR 2x10 Sidelying hip abd 2x10 hands on glute medius  Sidelying adduction  2x10  Nustep L1 x 6 min for motion and gentle quad activation Game Ready 15 min 3 flakes medium compression in elevation               Reviewed S&S of infection, Game ready 10 min, mod compression.    PATIENT EDUCATION:  Education details: Initiated HEP Person educated: Patient Education method: Consulting civil engineer, Media planner, Verbal cues, and Handouts Education comprehension:  verbalized understanding, returned demonstration, and verbal cues required     HOME EXERCISE PROGRAM: Access Code: [email or web address] URL: [email or web address] Previous exercises from L knee.      ASSESSMENT:   CLINICAL IMPRESSION: Bobby House is progressing very well as expected.  He has good quad activity at end range.  He has approx 100-110 flexion and full extension.  Swelling is moderate.  Incision healing well, but discussed not putting coco butter directly over incision line. Helped pt clean scar glue residue today per request.  He ambulates without a.d.  with minor antalgic gait.  He is managing pain fairly well but admits that the 5 mg oxycodone doesn't quite bring the pain down below threshold and he has to use some Tylenol or Dilaudid with the 5 mg oxy to control the pain.     OBJECTIVE IMPAIRMENTS Abnormal gait, difficulty walking, decreased ROM, decreased strength, increased muscle spasms, and pain.    ACTIVITY LIMITATIONS carrying, lifting, bending, sitting, standing, squatting, sleeping, stairs, transfers, and dressing   PARTICIPATION LIMITATIONS: meal prep, cleaning, laundry, driving, shopping, community activity, occupation, and yard work   PERSONAL FACTORS Fitness, Past/current experiences, and Profession are also affecting patient's functional outcome.    REHAB POTENTIAL: Excellent   CLINICAL DECISION MAKING: Stable/uncomplicated   EVALUATION COMPLEXITY: Low     GOALS: Goals reviewed with patient? Yes   SHORT TERM GOALS: Target date: 12/22/2021  Patient will be independent with initial HEP  Baseline: Goal status: Goal met 02/07/22   2.  Pain report to be no greater than 4/10  Baseline:  Goal status: NEW   3.  Left knee flexion AROM to 100 degrees Baseline: 90 Goal status: NEW     LONG TERM GOALS: Target date: 01/19/2022    Patient to be independent with  advanced HEP  Baseline:  Goal status: NEW   2.  Patient to report pain no greater than 2/10   Baseline:  Goal status: NEW   3.  Left knee flexion and extension to be within 5 degrees of uninvolved knee Baseline:  Goal status: NEW   4.  MMT to be 5/5 on left knee flexion and extension Baseline:  Goal status: NEW   5.  Patient to be able to ascend and descend steps with reciprocal gait without compensation Baseline:  Goal status: NEW   6.  Normalized gait with good heel to toe progression Baseline:  Goal status: NEW     PLAN: PT FREQUENCY: 3x/week   PT DURATION: 6 weeks   PLANNED INTERVENTIONS: Therapeutic exercises, Therapeutic activity, Neuromuscular re-education, Balance training, Gait training, Patient/Family education, Self Care, Joint mobilization, Stair training, DME instructions, Aquatic Therapy, Dry Needling, Electrical stimulation, Cryotherapy, Moist heat, Compression bandaging, Taping, Vasopneumatic device, Manual therapy, and Re-evaluation   PLAN FOR NEXT SESSION: recumbent bike for ROM,  progress quad rehab and functional strengthening   Rudi Heap PT, DPT 02/11/22  10:56 AM

## 2022-02-11 ENCOUNTER — Ambulatory Visit: Payer: BC Managed Care – PPO | Admitting: Physical Therapy

## 2022-02-11 ENCOUNTER — Encounter: Payer: Self-pay | Admitting: Physical Therapy

## 2022-02-11 DIAGNOSIS — R262 Difficulty in walking, not elsewhere classified: Secondary | ICD-10-CM

## 2022-02-11 DIAGNOSIS — Z96651 Presence of right artificial knee joint: Secondary | ICD-10-CM | POA: Diagnosis not present

## 2022-02-11 DIAGNOSIS — M25561 Pain in right knee: Secondary | ICD-10-CM

## 2022-02-11 DIAGNOSIS — M6281 Muscle weakness (generalized): Secondary | ICD-10-CM

## 2022-02-11 DIAGNOSIS — R600 Localized edema: Secondary | ICD-10-CM

## 2022-02-11 DIAGNOSIS — M25661 Stiffness of right knee, not elsewhere classified: Secondary | ICD-10-CM

## 2022-02-14 ENCOUNTER — Ambulatory Visit: Payer: BC Managed Care – PPO | Admitting: Physical Therapy

## 2022-02-14 ENCOUNTER — Encounter: Payer: Self-pay | Admitting: Physical Therapy

## 2022-02-14 DIAGNOSIS — M6281 Muscle weakness (generalized): Secondary | ICD-10-CM

## 2022-02-14 DIAGNOSIS — R262 Difficulty in walking, not elsewhere classified: Secondary | ICD-10-CM

## 2022-02-14 DIAGNOSIS — Z96651 Presence of right artificial knee joint: Secondary | ICD-10-CM | POA: Diagnosis not present

## 2022-02-14 DIAGNOSIS — M25661 Stiffness of right knee, not elsewhere classified: Secondary | ICD-10-CM

## 2022-02-14 DIAGNOSIS — M25561 Pain in right knee: Secondary | ICD-10-CM

## 2022-02-14 DIAGNOSIS — R600 Localized edema: Secondary | ICD-10-CM

## 2022-02-14 NOTE — Therapy (Signed)
OUTPATIENT PHYSICAL THERAPY TREATMENT NOTE   Patient Name: Bobby House MRN: [id number] DOB:1961-03-15, 61 y.o., male Today's Date: 02/14/2022  PCP: Marius Ditch, MD REFERRING PROVIDER: Derry Skill, MD  END OF SESSION:   PT End of Session - 02/14/22 1152     Visit Number 5    Number of Visits 24    Date for PT Re-Evaluation 04/08/22    Authorization Type BCBS    PT Start Time 1151   little late   PT Stop Time 75    PT Time Calculation (min) 39 min    Activity Tolerance Patient tolerated treatment well    Behavior During Therapy WFL for tasks assessed/performed               Past Medical History:  Diagnosis Date   ADD (attention deficit disorder)    DDD (degenerative disc disease), lumbar    Hypertension    Prostatitis    Umbilical hernia    Past Surgical History:  Procedure Laterality Date   LUMBAR DISC SURGERY     Patient Active Problem List   Diagnosis Date Noted   Ganglion cyst Left post lat knee 05/28/2021   Primary osteoarthritis of left knee 05/28/2021   Anxiety state, unspecified 01/09/2013   Attention deficit disorder without mention of hyperactivity 01/09/2013   Adjustment disorder with mixed anxiety and depressed mood 09/19/2012    REFERRING DIAG: R51.884 (ICD-10-CM) - Presence of left artificial knee joint   THERAPY DIAG:  Muscle weakness (generalized)  Difficulty in walking, not elsewhere classified  Stiffness of right knee, not elsewhere classified  Acute pain of right knee  Localized edema  Rationale for Evaluation and Treatment Rehabilitation  PERTINENT HISTORY: hx lumbar fusion    PRECAUTIONS: None  SUBJECTIVE:                                                                                                                                                                                      SUBJECTIVE STATEMENT:  Pt states that he has a hard time sleeping at night. Otherwise he is doing well.    PAIN:  Are you  having pain? Yes: NPRS scale: 5/10 Pain location: Rt knee Pain description: Sore Aggravating factors: Overdoing it Relieving factors: ice   OBJECTIVE: (objective measures completed at initial evaluation unless otherwise dated)  DIAGNOSTIC FINDINGS: na   PATIENT SURVEYS:  FOTO: 44.03%, 74% in 17 visits.    COGNITION:           Overall cognitive status: Within functional limits for tasks assessed  SENSATION: WFL   POSTURE:  Normal.      LOWER EXTREMITY ROM:   Active ROM Right eval Left eval Right 02/07/22  Knee flexion 90  113 100  Knee extension -2 (lacking) 0    (Blank rows = not tested)   LOWER EXTREMITY MMT:            Right LE: not tested today. Left knee generally 5/5.       FUNCTIONAL TESTS:  Initial eval: 5 times sit to stand:  15.68 sec  Timed up and go (TUG): 10.85 sec   GAIT: 12/29/21 Distance walked: 300 ft Assistive device utilized: None Level of assistance: Complete Independence Comments: antalgic   TODAY'S TREATMENT:   02/14/22: Recumbent bike L1 x 10 min Sit to stand off mat table and 1 mat; 2x10 Supine SLR 2x10 3# at ankle S/L hip abd 3# 2x10 VC to keep LE more neutral Prone SLR 3# 2x10 Soft tissue release to RT RF/TFL Game Ready 10 min 3 flakes medium compression in elevation   02/09/22: Recumbent bike with full revolutions(PT present to discuss status and progress) Supine quad set x 20 right Supine TKE x 20 right Supine SAQ x 20 right  Supine SLR x 20 right  Seated heel slides x 10 Sit to stand x 10 with no need for verbal cues for alignment  Short axis distraction with OP into flexion Clean residue of glue, per request. Game Ready 10 min 3 flakes medium compression in elevation   02/07/22: Seated heel slides with slider 20x QS with SLR 2x10 Sidelying hip abd 2x10 hands on glute medius  Sidelying adduction  2x10  Nustep L1 x 6 min for motion and gentle quad activation Game Ready 15 min 3 flakes  medium compression in elevation               Reviewed S&S of infection, Game ready 10 min, mod compression.    PATIENT EDUCATION:  Education details: Initiated HEP Person educated: Patient Education method: Consulting civil engineer, Media planner, Verbal cues, and Handouts Education comprehension: verbalized understanding, returned demonstration, and verbal cues required     HOME EXERCISE PROGRAM: Access Code: [email or web address] URL: [email or web address] Previous exercises from L knee.      ASSESSMENT:   CLINICAL IMPRESSION: Pt arrives today with mild pain. Sleeping at night is his main complaint as the pain can be very intense. RT RT very tight and in spasm. Pt will continue to work on this at home with his massage gun.    OBJECTIVE IMPAIRMENTS Abnormal gait, difficulty walking, decreased ROM, decreased strength, increased muscle spasms, and pain.    ACTIVITY LIMITATIONS carrying, lifting, bending, sitting, standing, squatting, sleeping, stairs, transfers, and dressing   PARTICIPATION LIMITATIONS: meal prep, cleaning, laundry, driving, shopping, community activity, occupation, and yard work   PERSONAL FACTORS Fitness, Past/current experiences, and Profession are also affecting patient's functional outcome.    REHAB POTENTIAL: Excellent   CLINICAL DECISION MAKING: Stable/uncomplicated   EVALUATION COMPLEXITY: Low     GOALS: Goals reviewed with patient? Yes   SHORT TERM GOALS: Target date: 12/22/2021  Patient will be independent with initial HEP  Baseline: Goal status: Goal met 02/07/22   2.  Pain report to be no greater than 4/10  Baseline:  Goal status: NEW   3.  Left knee flexion AROM to 100 degrees Baseline: 90 Goal status: NEW     LONG TERM GOALS: Target date: 01/19/2022    Patient to be independent with advanced HEP  Baseline:  Goal  status: NEW   2.  Patient to report pain no greater than 2/10  Baseline:  Goal status: NEW   3.  Left knee flexion and  extension to be within 5 degrees of uninvolved knee Baseline:  Goal status: NEW   4.  MMT to be 5/5 on left knee flexion and extension Baseline:  Goal status: NEW   5.  Patient to be able to ascend and descend steps with reciprocal gait without compensation Baseline:  Goal status: NEW   6.  Normalized gait with good heel to toe progression Baseline:  Goal status: NEW     PLAN: PT FREQUENCY: 3x/week   PT DURATION: 6 weeks   PLANNED INTERVENTIONS: Therapeutic exercises, Therapeutic activity, Neuromuscular re-education, Balance training, Gait training, Patient/Family education, Self Care, Joint mobilization, Stair training, DME instructions, Aquatic Therapy, Dry Needling, Electrical stimulation, Cryotherapy, Moist heat, Compression bandaging, Taping, Vasopneumatic device, Manual therapy, and Re-evaluation   PLAN FOR NEXT SESSION: recumbent bike for ROM,  progress quad rehab and functional strengthening, check RT RF spasm.   Myrene Galas, PTA 02/14/22 12:23 PM  02/14/22  12:23 PM

## 2022-02-16 ENCOUNTER — Ambulatory Visit: Payer: BC Managed Care – PPO | Attending: Orthopedic Surgery

## 2022-02-16 ENCOUNTER — Telehealth

## 2022-02-16 DIAGNOSIS — R600 Localized edema: Secondary | ICD-10-CM | POA: Insufficient documentation

## 2022-02-16 DIAGNOSIS — M25661 Stiffness of right knee, not elsewhere classified: Secondary | ICD-10-CM | POA: Diagnosis present

## 2022-02-16 DIAGNOSIS — M25662 Stiffness of left knee, not elsewhere classified: Secondary | ICD-10-CM | POA: Diagnosis present

## 2022-02-16 DIAGNOSIS — M6281 Muscle weakness (generalized): Secondary | ICD-10-CM | POA: Insufficient documentation

## 2022-02-16 DIAGNOSIS — R262 Difficulty in walking, not elsewhere classified: Secondary | ICD-10-CM | POA: Insufficient documentation

## 2022-02-16 DIAGNOSIS — M25561 Pain in right knee: Secondary | ICD-10-CM | POA: Diagnosis not present

## 2022-02-16 MED ORDER — HYDROCODONE-ACETAMINOPHEN 5-325 MG PO TABS
5-325 MG | ORAL_TABLET | Freq: Four times a day (QID) | ORAL | 0 refills | Status: AC | PRN
Start: 2022-02-16 — End: 2022-02-23

## 2022-02-16 NOTE — Therapy (Signed)
OUTPATIENT PHYSICAL THERAPY TREATMENT NOTE   Patient Name: Bobby House MRN: [id number] DOB:[birth year], 61 y.o., male Today's Date: 02/16/2022  PCP: Marius Ditch, MD REFERRING PROVIDER: Derry Skill, MD  END OF SESSION:   PT End of Session - 02/16/22 1157     Visit Number 6    Number of Visits 24    Date for PT Re-Evaluation 04/08/22    Authorization Type BCBS    PT Start Time 1149    PT Stop Time 1240    PT Time Calculation (min) 51 min    Activity Tolerance Patient tolerated treatment well    Behavior During Therapy WFL for tasks assessed/performed               Past Medical History:  Diagnosis Date   ADD (attention deficit disorder)    DDD (degenerative disc disease), lumbar    Hypertension    Prostatitis    Umbilical hernia    Past Surgical History:  Procedure Laterality Date   LUMBAR DISC SURGERY     Patient Active Problem List   Diagnosis Date Noted   Ganglion cyst Left post lat knee 05/28/2021   Primary osteoarthritis of left knee 05/28/2021   Anxiety state, unspecified 01/09/2013   Attention deficit disorder without mention of hyperactivity 01/09/2013   Adjustment disorder with mixed anxiety and depressed mood 09/19/2012    REFERRING DIAG: C78.938 (ICD-10-CM) - Presence of left artificial knee joint   THERAPY DIAG:  Acute pain of right knee  Stiffness of right knee, not elsewhere classified  Difficulty in walking, not elsewhere classified  Muscle weakness (generalized)  Stiffness of left knee, not elsewhere classified  Rationale for Evaluation and Treatment Rehabilitation  PERTINENT HISTORY: hx lumbar fusion    PRECAUTIONS: None  SUBJECTIVE:                                                                                                                                                                                      SUBJECTIVE STATEMENT:  Pt states that he is still having a hard time sleeping at night. Otherwise he is  doing well.    PAIN:  Are you having pain? Yes: NPRS scale: 5/10 Pain location: Rt knee Pain description: Sore Aggravating factors: Overdoing it Relieving factors: ice   OBJECTIVE: (objective measures completed at initial evaluation unless otherwise dated)  DIAGNOSTIC FINDINGS: na   PATIENT SURVEYS:  FOTO: 44.03%, 74% in 17 visits.    COGNITION:           Overall cognitive status: Within functional limits for tasks assessed  SENSATION: WFL   POSTURE:  Normal.      LOWER EXTREMITY ROM:   Active ROM Right eval Left eval Right 02/07/22  Knee flexion 90  113 100  Knee extension -2 (lacking) 0    (Blank rows = not tested)   LOWER EXTREMITY MMT:            Right LE: not tested today. Left knee generally 5/5.       FUNCTIONAL TESTS:  Initial eval: 5 times sit to stand:  15.68 sec  Timed up and go (TUG): 10.85 sec   GAIT: 12/29/21 Distance walked: 300 ft Assistive device utilized: None Level of assistance: Complete Independence Comments: antalgic   TODAY'S TREATMENT:  02/16/22: Recumbent bike L1 x 5 min Sit to stand off from chair x 10 Squat to chair x 10 Seated LAQ with 5 lb mat table seated balance pad Seated hip ER with 5 lb mat table seated on balance Up and down steps x 3 working on proper/safe sequencing Hamstring stretching at steps 5 x 10 sec right  Hip flexor stretching at steps 5 x 10 sec right Standing gastroc/soleus stretching right 5 x 10 sec each Game Ready 10 min 3 flakes medium compression in elevation (attempted but machine malfunctioned after approx 5 min)   02/14/22: Recumbent bike L1 x 10 min Sit to stand off mat table and 1 mat; 2x10 Supine SLR 2x10 3# at ankle S/L hip abd 3# 2x10 VC to keep LE more neutral Prone SLR 3# 2x10 Soft tissue release to RT RF/TFL Game Ready 10 min 3 flakes medium compression in elevation   02/09/22: Recumbent bike with full revolutions(PT present to discuss status and  progress) Supine quad set x 20 right Supine TKE x 20 right Supine SAQ x 20 right  Supine SLR x 20 right  Seated heel slides x 10 Sit to stand x 10 with no need for verbal cues for alignment  Short axis distraction with OP into flexion Clean residue of glue, per request. Game Ready 10 min 3 flakes medium compression in elevation                 Reviewed S&S of infection, Game ready 10 min, mod compression.    PATIENT EDUCATION:  Education details: Initiated HEP Person educated: Patient Education method: Consulting civil engineer, Media planner, Verbal cues, and Handouts Education comprehension: verbalized understanding, returned demonstration, and verbal cues required     HOME EXERCISE PROGRAM: Access Code: [email or web address] URL: [email or web address] Previous exercises from L knee.      ASSESSMENT:   CLINICAL IMPRESSION: Bobby House is progressing appropriately.  He needed reminders that he is only 2 weeks post op and to avoid being overly aggressive with flexion which can cause increased scar tissue production.  He has good return of ROM and alignment is greatly improved.  He is able to perform sit to stand with good weight shift.  He is very compliant and well motivated.  He would benefit from continuing skilled PT to restore patient to prior level of function following post TKA protocol.     OBJECTIVE IMPAIRMENTS Abnormal gait, difficulty walking, decreased ROM, decreased strength, increased muscle spasms, and pain.    ACTIVITY LIMITATIONS carrying, lifting, bending, sitting, standing, squatting, sleeping, stairs, transfers, and dressing   PARTICIPATION LIMITATIONS: meal prep, cleaning, laundry, driving, shopping, community activity, occupation, and yard work   PERSONAL FACTORS Fitness, Past/current experiences, and Profession are also affecting patient's functional outcome.    REHAB POTENTIAL: Excellent   CLINICAL DECISION  MAKING: Stable/uncomplicated   EVALUATION COMPLEXITY:  Low     GOALS: Goals reviewed with patient? Yes   SHORT TERM GOALS: Target date: 12/22/2021  Patient will be independent with initial HEP  Baseline: Goal status: Goal met 02/07/22   2.  Pain report to be no greater than 4/10  Baseline:  Goal status: NEW   3.  Left knee flexion AROM to 100 degrees Baseline: 90 Goal status: NEW     LONG TERM GOALS: Target date: 01/19/2022    Patient to be independent with advanced HEP  Baseline:  Goal status: NEW   2.  Patient to report pain no greater than 2/10  Baseline:  Goal status: NEW   3.  Left knee flexion and extension to be within 5 degrees of uninvolved knee Baseline:  Goal status: NEW   4.  MMT to be 5/5 on left knee flexion and extension Baseline:  Goal status: NEW   5.  Patient to be able to ascend and descend steps with reciprocal gait without compensation Baseline:  Goal status: NEW   6.  Normalized gait with good heel to toe progression Baseline:  Goal status: NEW     PLAN: PT FREQUENCY: 3x/week   PT DURATION: 6 weeks   PLANNED INTERVENTIONS: Therapeutic exercises, Therapeutic activity, Neuromuscular re-education, Balance training, Gait training, Patient/Family education, Self Care, Joint mobilization, Stair training, DME instructions, Aquatic Therapy, Dry Needling, Electrical stimulation, Cryotherapy, Moist heat, Compression bandaging, Taping, Vasopneumatic device, Manual therapy, and Re-evaluation   PLAN FOR NEXT SESSION: recumbent bike for ROM,  progress quad rehab and functional strengthening, check RT RF spasm.   Anderson Malta B. Kynadi Dragos, PT 02/16/22 1:32 PM   [facility] [address] [place], [place] [address] Phone # [phone] Fax [phone]

## 2022-02-16 NOTE — Telephone Encounter (Signed)
Patient is requesting refill on pain medication.     TKR 02/01/2022    Last refill 02/07/2022    [facility] [phone] [place], [place]

## 2022-02-17 NOTE — Therapy (Signed)
OUTPATIENT PHYSICAL THERAPY TREATMENT NOTE   Patient Name: Bobby House MRN: [id number] DOB:13-May-1960, 61 y.o., male Today's Date: 02/18/2022  PCP: Marius Ditch, MD REFERRING PROVIDER: Derry Skill, MD  END OF SESSION:   PT End of Session - 02/18/22 1010     Visit Number 7    Number of Visits 24    Date for PT Re-Evaluation 04/08/22    Authorization Type BCBS    PT Start Time 1010    PT Stop Time 1052    PT Time Calculation (min) 42 min    Activity Tolerance Patient tolerated treatment well    Behavior During Therapy WFL for tasks assessed/performed                Past Medical History:  Diagnosis Date   ADD (attention deficit disorder)    DDD (degenerative disc disease), lumbar    Hypertension    Prostatitis    Umbilical hernia    Past Surgical History:  Procedure Laterality Date   LUMBAR DISC SURGERY     Patient Active Problem List   Diagnosis Date Noted   Ganglion cyst Left post lat knee 05/28/2021   Primary osteoarthritis of left knee 05/28/2021   Anxiety state, unspecified 01/09/2013   Attention deficit disorder without mention of hyperactivity 01/09/2013   Adjustment disorder with mixed anxiety and depressed mood 09/19/2012    REFERRING DIAG: K56.256 (ICD-10-CM) - Presence of left artificial knee joint   THERAPY DIAG:  Acute pain of right knee  Stiffness of right knee, not elsewhere classified  Difficulty in walking, not elsewhere classified  Muscle weakness (generalized)  Localized edema  Rationale for Evaluation and Treatment Rehabilitation  PERTINENT HISTORY: hx lumbar fusion    PRECAUTIONS: None  SUBJECTIVE:                                                                                                                                                                                      SUBJECTIVE STATEMENT:  Sleeping still sucks.    PAIN:  Are you having pain? Yes: NPRS scale: 5/10 Pain location: Rt knee Pain  description: Sore Aggravating factors: Overdoing it Relieving factors: ice   OBJECTIVE: (objective measures completed at initial evaluation unless otherwise dated)  DIAGNOSTIC FINDINGS: na   PATIENT SURVEYS:  FOTO: 44.03%, 74% in 17 visits.    COGNITION:           Overall cognitive status: Within functional limits for tasks assessed                          SENSATION: WFL   POSTURE:  Normal.  LOWER EXTREMITY ROM:   Active ROM Right eval Left eval Right 02/07/22  Knee flexion 90  113 100  Knee extension -2 (lacking) 0    (Blank rows = not tested)   LOWER EXTREMITY MMT:            Right LE: not tested today. Left knee generally 5/5.       FUNCTIONAL TESTS:  Initial eval: 5 times sit to stand:  15.68 sec  Timed up and go (TUG): 10.85 sec   GAIT: 12/29/21 Distance walked: 300 ft Assistive device utilized: None Level of assistance: Complete Independence Comments: antalgic   TODAY'S TREATMENT:  02/16/22: Recumbent bike L2 x 5 min Sit to stand off from chair x 10 Squat to chair x 10 Supine quad stretch 2x30 sec  Seated LAQ with 5 lb mat table  Seated hip ER with 5 lb mat table  Up and down steps x 3 working on proper/safe sequencing Hamstring stretching at steps 5 x 10 sec right  Standing gastroc/soleus stretching right 5 x 10 sec each Lunges onto BOSU ball Single leg press 70# 2x10  Game Ready 10 min 3 flakes medium compression in elevation (attempted but machine malfunctioned after approx 7 min)   02/14/22: Recumbent bike L1 x 10 min Sit to stand off mat table and 1 mat; 2x10 Supine SLR 2x10 3# at ankle S/L hip abd 3# 2x10 VC to keep LE more neutral Prone SLR 3# 2x10 Soft tissue release to RT RF/TFL Game Ready 10 min 3 flakes medium compression in elevation   02/09/22: Recumbent bike with full revolutions(PT present to discuss status and progress) Supine quad set x 20 right Supine TKE x 20 right Supine SAQ x 20 right  Supine SLR x 20 right   Seated heel slides x 10 Sit to stand x 10 with no need for verbal cues for alignment  Short axis distraction with OP into flexion Clean residue of glue, per request. Game Ready 10 min 3 flakes medium compression in elevation                 Reviewed S&S of infection, Game ready 10 min, mod compression.    PATIENT EDUCATION:  Education details: Initiated HEP Person educated: Patient Education method: Consulting civil engineer, Media planner, Verbal cues, and Handouts Education comprehension: verbalized understanding, returned demonstration, and verbal cues required     HOME EXERCISE PROGRAM: Access Code: [email or web address] URL: [email or web address] Previous exercises from L knee.      ASSESSMENT:   CLINICAL IMPRESSION: Tige is progressing appropriately. He has good return of ROM reaching 103 today in supine and alignment is greatly improved. He was able to tolerate more weight with single leg press today.   He is very compliant and well motivated. He would benefit from continuing skilled PT to restore patient to prior level of function following post TKA protocol.     OBJECTIVE IMPAIRMENTS Abnormal gait, difficulty walking, decreased ROM, decreased strength, increased muscle spasms, and pain.    ACTIVITY LIMITATIONS carrying, lifting, bending, sitting, standing, squatting, sleeping, stairs, transfers, and dressing   PARTICIPATION LIMITATIONS: meal prep, cleaning, laundry, driving, shopping, community activity, occupation, and yard work   PERSONAL FACTORS Fitness, Past/current experiences, and Profession are also affecting patient's functional outcome.    REHAB POTENTIAL: Excellent   CLINICAL DECISION MAKING: Stable/uncomplicated   EVALUATION COMPLEXITY: Low     GOALS: Goals reviewed with patient? Yes   SHORT TERM GOALS: Target date: 12/22/2021  Patient will be independent with initial HEP  Baseline: Goal status: Goal met 02/07/22   2.  Pain report to be no greater than  4/10  Baseline:  Goal status: NEW   3.  Left knee flexion AROM to 100 degrees Baseline: 90 Goal status: NEW     LONG TERM GOALS: Target date: 01/19/2022    Patient to be independent with advanced HEP  Baseline:  Goal status: NEW   2.  Patient to report pain no greater than 2/10  Baseline:  Goal status: NEW   3.  Left knee flexion and extension to be within 5 degrees of uninvolved knee Baseline:  Goal status: NEW   4.  MMT to be 5/5 on left knee flexion and extension Baseline:  Goal status: NEW   5.  Patient to be able to ascend and descend steps with reciprocal gait without compensation Baseline:  Goal status: NEW   6.  Normalized gait with good heel to toe progression Baseline:  Goal status: NEW     PLAN: PT FREQUENCY: 3x/week   PT DURATION: 6 weeks   PLANNED INTERVENTIONS: Therapeutic exercises, Therapeutic activity, Neuromuscular re-education, Balance training, Gait training, Patient/Family education, Self Care, Joint mobilization, Stair training, DME instructions, Aquatic Therapy, Dry Needling, Electrical stimulation, Cryotherapy, Moist heat, Compression bandaging, Taping, Vasopneumatic device, Manual therapy, and Re-evaluation   PLAN FOR NEXT SESSION: recumbent bike for ROM,  progress quad rehab and functional strengthening, check RT RF spasm.   Rudi Heap PT, DPT 02/18/22  10:54 AM   [facility] [address] [place], [place] [address] Phone # [phone] Fax [phone]

## 2022-02-18 ENCOUNTER — Ambulatory Visit: Payer: BC Managed Care – PPO | Admitting: Physical Therapy

## 2022-02-18 ENCOUNTER — Encounter: Payer: Self-pay | Admitting: Physical Therapy

## 2022-02-18 DIAGNOSIS — M25561 Pain in right knee: Secondary | ICD-10-CM

## 2022-02-18 DIAGNOSIS — R600 Localized edema: Secondary | ICD-10-CM

## 2022-02-18 DIAGNOSIS — R262 Difficulty in walking, not elsewhere classified: Secondary | ICD-10-CM

## 2022-02-18 DIAGNOSIS — M6281 Muscle weakness (generalized): Secondary | ICD-10-CM

## 2022-02-18 DIAGNOSIS — M25661 Stiffness of right knee, not elsewhere classified: Secondary | ICD-10-CM

## 2022-02-21 ENCOUNTER — Ambulatory Visit: Payer: BC Managed Care – PPO | Admitting: Physical Therapy

## 2022-02-21 ENCOUNTER — Encounter: Payer: Self-pay | Admitting: Physical Therapy

## 2022-02-21 DIAGNOSIS — M6281 Muscle weakness (generalized): Secondary | ICD-10-CM

## 2022-02-21 DIAGNOSIS — M25661 Stiffness of right knee, not elsewhere classified: Secondary | ICD-10-CM

## 2022-02-21 DIAGNOSIS — R600 Localized edema: Secondary | ICD-10-CM

## 2022-02-21 DIAGNOSIS — M25561 Pain in right knee: Secondary | ICD-10-CM | POA: Diagnosis not present

## 2022-02-21 DIAGNOSIS — R262 Difficulty in walking, not elsewhere classified: Secondary | ICD-10-CM

## 2022-02-21 NOTE — Therapy (Signed)
OUTPATIENT PHYSICAL THERAPY TREATMENT NOTE   Patient Name: Bobby House MRN: [id number] DOB:02-24-1961, 61 y.o., male Today's Date: 02/21/2022  PCP: Marius Ditch, MD REFERRING PROVIDER: Derry Skill, MD  END OF SESSION:   PT End of Session - 02/21/22 1145     Visit Number 8    Number of Visits 24    Date for PT Re-Evaluation 04/08/22    Authorization Type BCBS    PT Start Time 1142    PT Stop Time 7340    PT Time Calculation (min) 53 min    Activity Tolerance Patient tolerated treatment well    Behavior During Therapy WFL for tasks assessed/performed                 Past Medical History:  Diagnosis Date   ADD (attention deficit disorder)    DDD (degenerative disc disease), lumbar    Hypertension    Prostatitis    Umbilical hernia    Past Surgical History:  Procedure Laterality Date   LUMBAR DISC SURGERY     Patient Active Problem List   Diagnosis Date Noted   Ganglion cyst Left post lat knee 05/28/2021   Primary osteoarthritis of left knee 05/28/2021   Anxiety state, unspecified 01/09/2013   Attention deficit disorder without mention of hyperactivity 01/09/2013   Adjustment disorder with mixed anxiety and depressed mood 09/19/2012    REFERRING DIAG: Z70.964 (ICD-10-CM) - Presence of left artificial knee joint   THERAPY DIAG:  Acute pain of right knee  Stiffness of right knee, not elsewhere classified  Difficulty in walking, not elsewhere classified  Muscle weakness (generalized)  Localized edema  Rationale for Evaluation and Treatment Rehabilitation  PERTINENT HISTORY: hx lumbar fusion    PRECAUTIONS: None  SUBJECTIVE:                                                                                                                                                                                      SUBJECTIVE STATEMENT: My left low back is tight, knee still sore and swollen.    PAIN:  Are you having pain? Yes: NPRS scale:  5/10 Pain location: Rt knee and LT low back Pain description: Sore Aggravating factors: Overdoing it Relieving factors: ice   OBJECTIVE: (objective measures completed at initial evaluation unless otherwise dated)  DIAGNOSTIC FINDINGS: na   PATIENT SURVEYS:  FOTO: 44.03%, 74% in 17 visits.    COGNITION:           Overall cognitive status: Within functional limits for tasks assessed  SENSATION: WFL   POSTURE:  Normal.      LOWER EXTREMITY ROM:   Active ROM Right eval Left eval Right 02/07/22  Knee flexion 90  113 100  Knee extension -2 (lacking) 0    (Blank rows = not tested)   LOWER EXTREMITY MMT:            Right LE: not tested today. Left knee generally 5/5.       FUNCTIONAL TESTS:  Initial eval: 5 times sit to stand:  15.68 sec  Timed up and go (TUG): 10.85 sec   GAIT: 12/29/21 Distance walked: 300 ft Assistive device utilized: None Level of assistance: Complete Independence Comments: antalgic   TODAY'S TREATMENT:   02/18/22: Recumbent bike L2 x 8 min, PTA present Sit to stand off from mat table holding 10# KB x 10 Attempted quad stretch putting foot on the low mat: too much discomfort in the knee: pt could do off the end of the mat and in standing at the stairs with LTLE up on 2nd step. 3x 3 breaths each Seated LAQ with 6 lb mat table 2x10 Supine clamshells 20x, then static stretch in supine ER 2 min,  Hamstring stretching at steps 3 x 20 sec right  Standing gastroc/soleus stretching right 5 x 10 sec each Single leg press 70# x10  Game Ready 10 min 3 flakes medium   02/16/22: Recumbent bike L2 x 5 min Sit to stand off from chair x 10 Squat to chair x 10 Supine quad stretch 2x30 sec  Seated LAQ with 5 lb mat table  Seated hip ER with 5 lb mat table  Up and down steps x 3 working on proper/safe sequencing Hamstring stretching at steps 5 x 10 sec right  Standing gastroc/soleus stretching right 5 x 10 sec each Lunges onto  BOSU ball Single leg press 70# 2x10  Game Ready 10 min 3 flakes medium compression in elevation (attempted but machine malfunctioned after approx 7 min)   02/14/22: Recumbent bike L1 x 10 min Sit to stand off mat table and 1 mat; 2x10 Supine SLR 2x10 3# at ankle S/L hip abd 3# 2x10 VC to keep LE more neutral Prone SLR 3# 2x10 Soft tissue release to RT RF/TFL Game Ready 10 min 3 flakes medium compression in elevation   02/09/22: Recumbent bike with full revolutions(PT present to discuss status and progress) Supine quad set x 20 right Supine TKE x 20 right Supine SAQ x 20 right  Supine SLR x 20 right  Seated heel slides x 10 Sit to stand x 10 with no need for verbal cues for alignment  Short axis distraction with OP into flexion Clean residue of glue, per request. Game Ready 10 min 3 flakes medium compression in elevation                 Reviewed S&S of infection, Game ready 10 min, mod compression.    PATIENT EDUCATION:  Education details: Initiated HEP Person educated: Patient Education method: Consulting civil engineer, Media planner, Verbal cues, and Handouts Education comprehension: verbalized understanding, returned demonstration, and verbal cues required     HOME EXERCISE PROGRAM: Access Code: [email or web address] URL: [email or web address] Previous exercises from L knee.      ASSESSMENT:   CLINICAL IMPRESSION:  Pt arrives with knee soreness, RT proximal quad soreness, and Lt low back soreness. Pt will try to be more compliant with stretching his hips at home. Pt's RT proximal quad and hip ER are significantly tight.    OBJECTIVE  IMPAIRMENTS Abnormal gait, difficulty walking, decreased ROM, decreased strength, increased muscle spasms, and pain.    ACTIVITY LIMITATIONS carrying, lifting, bending, sitting, standing, squatting, sleeping, stairs, transfers, and dressing   PARTICIPATION LIMITATIONS: meal prep, cleaning, laundry, driving, shopping, community activity,  occupation, and yard work   PERSONAL FACTORS Fitness, Past/current experiences, and Profession are also affecting patient's functional outcome.    REHAB POTENTIAL: Excellent   CLINICAL DECISION MAKING: Stable/uncomplicated   EVALUATION COMPLEXITY: Low     GOALS: Goals reviewed with patient? Yes   SHORT TERM GOALS: Target date: 12/22/2021  Patient will be independent with initial HEP  Baseline: Goal status: Goal met 02/07/22   2.  Pain report to be no greater than 4/10  Baseline:  Goal status: NEW   3.  Left knee flexion AROM to 100 degrees Baseline: 90 Goal status: NEW     LONG TERM GOALS: Target date: 01/19/2022    Patient to be independent with advanced HEP  Baseline:  Goal status: NEW   2.  Patient to report pain no greater than 2/10  Baseline:  Goal status: NEW   3.  Left knee flexion and extension to be within 5 degrees of uninvolved knee Baseline:  Goal status: NEW   4.  MMT to be 5/5 on left knee flexion and extension Baseline:  Goal status: NEW   5.  Patient to be able to ascend and descend steps with reciprocal gait without compensation Baseline:  Goal status: NEW   6.  Normalized gait with good heel to toe progression Baseline:  Goal status: NEW     PLAN: PT FREQUENCY: 3x/week   PT DURATION: 6 weeks   PLANNED INTERVENTIONS: Therapeutic exercises, Therapeutic activity, Neuromuscular re-education, Balance training, Gait training, Patient/Family education, Self Care, Joint mobilization, Stair training, DME instructions, Aquatic Therapy, Dry Needling, Electrical stimulation, Cryotherapy, Moist heat, Compression bandaging, Taping, Vasopneumatic device, Manual therapy, and Re-evaluation   PLAN FOR NEXT SESSION: Hip stretching, quad strength  Myrene Galas, PTA    02/21/22 12:24 PM   [facility] [address], [place], [place] [address] Phone # [phone] Fax [phone]

## 2022-02-23 ENCOUNTER — Ambulatory Visit: Payer: BC Managed Care – PPO

## 2022-02-23 DIAGNOSIS — M25561 Pain in right knee: Secondary | ICD-10-CM

## 2022-02-23 DIAGNOSIS — R262 Difficulty in walking, not elsewhere classified: Secondary | ICD-10-CM

## 2022-02-23 DIAGNOSIS — M6281 Muscle weakness (generalized): Secondary | ICD-10-CM

## 2022-02-23 DIAGNOSIS — M25661 Stiffness of right knee, not elsewhere classified: Secondary | ICD-10-CM

## 2022-02-23 NOTE — Therapy (Signed)
OUTPATIENT PHYSICAL THERAPY TREATMENT NOTE   Patient Name: Bobby House MRN: [id number] DOB:01/15/1961, 61 y.o., male Today's Date: 02/23/2022  PCP: Marius Ditch, MD REFERRING PROVIDER: Derry Skill, MD  END OF SESSION:   PT End of Session - 02/23/22 1212     Visit Number 9    Number of Visits 24    Date for PT Re-Evaluation 04/08/22    Authorization Type BCBS    PT Start Time 1149    PT Stop Time 1230    PT Time Calculation (min) 41 min    Activity Tolerance Patient tolerated treatment well    Behavior During Therapy WFL for tasks assessed/performed                 Past Medical History:  Diagnosis Date   ADD (attention deficit disorder)    DDD (degenerative disc disease), lumbar    Hypertension    Prostatitis    Umbilical hernia    Past Surgical History:  Procedure Laterality Date   LUMBAR DISC SURGERY     Patient Active Problem List   Diagnosis Date Noted   Ganglion cyst Left post lat knee 05/28/2021   Primary osteoarthritis of left knee 05/28/2021   Anxiety state, unspecified 01/09/2013   Attention deficit disorder without mention of hyperactivity 01/09/2013   Adjustment disorder with mixed anxiety and depressed mood 09/19/2012    REFERRING DIAG: P80.998 (ICD-10-CM) - Presence of left artificial knee joint   THERAPY DIAG:  Acute pain of right knee  Stiffness of right knee, not elsewhere classified  Difficulty in walking, not elsewhere classified  Muscle weakness (generalized)  Rationale for Evaluation and Treatment Rehabilitation  PERTINENT HISTORY: hx lumbar fusion    PRECAUTIONS: None  SUBJECTIVE:                                                                                                                                                                                      SUBJECTIVE STATEMENT: Continued  left low back pain with mild left LE weakness feeling, right knee still sore and swollen.    PAIN:  Are you having  pain? Yes: NPRS scale: 5/10 Pain location: Rt knee and LT low back Pain description: Sore Aggravating factors: Overdoing it Relieving factors: ice   OBJECTIVE: (objective measures completed at initial evaluation unless otherwise dated)  DIAGNOSTIC FINDINGS: na   PATIENT SURVEYS:  FOTO: 44.03%, 74% in 17 visits.    COGNITION:           Overall cognitive status: Within functional limits for tasks assessed  SENSATION: WFL   POSTURE:  Normal.      LOWER EXTREMITY ROM:   Active ROM Right eval Left eval Right 02/07/22  Knee flexion 90  113 100  Knee extension -2 (lacking) 0    (Blank rows = not tested)   LOWER EXTREMITY MMT:            Right LE: not tested today. Left knee generally 5/5.       FUNCTIONAL TESTS:  Initial eval: 5 times sit to stand:  15.68 sec  Timed up and go (TUG): 10.85 sec   GAIT: 12/29/21 Distance walked: 300 ft Assistive device utilized: None Level of assistance: Complete Independence Comments: antalgic   TODAY'S TREATMENT:  02/23/22: Recumbent bike L2 x 8 min, PTA present Sit to stand off from mat table holding 2 x 10 Supine IT stretch bilaterally 3 x 30 sec each Lateral band walks with yellow loop x 3 laps of 10 steps Supine piriformis stretch (attempted several techniques) patient able to do on left but right side had to be done seated 3 x 30 sec Lengthy discussion about pain control for back and knee and how his knee position since surgery will have a kinetic chain effect and may cause some back pain.  We discussed various medications and what has been prescribed vs what he needs to control pain.  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  He feels that he accommodated to the medication from the first knee and is now having to take higher doses for the right knee.  However, MD will not prescribe due to concern over resistance.  Patient has stopped taking all pain meds because the 5/325 mg does not  help at all.  He has completed his aspirin regimen and is now able to take Ibuprofen which he feels helps more than the pain medication.   Game ready to right knee x 15 min, mod compression, 3 snowflakes.   02/18/22: Recumbent bike L2 x 8 min, PTA present Sit to stand off from mat table holding 10# KB x 10 Attempted quad stretch putting foot on the low mat: too much discomfort in the knee: pt could do off the end of the mat and in standing at the stairs with LTLE up on 2nd step. 3x 3 breaths each Seated LAQ with 6 lb mat table 2x10 Supine clamshells 20x, then static stretch in supine ER 2 min,  Hamstring stretching at steps 3 x 20 sec right  Standing gastroc/soleus stretching right 5 x 10 sec each Single leg press 70# x10  Game Ready 10 min 3 flakes medium   02/16/22: Recumbent bike L2 x 5 min Sit to stand off from chair x 10 Squat to chair x 10 Supine quad stretch 2x30 sec  Seated LAQ with 5 lb mat table  Seated hip ER with 5 lb mat table  Up and down steps x 3 working on proper/safe sequencing Hamstring stretching at steps 5 x 10 sec right  Standing gastroc/soleus stretching right 5 x 10 sec each Lunges onto BOSU ball Single leg press 70# 2x10  Game Ready 10 min 3 flakes medium compression in elevation (attempted but machine malfunctioned after approx 7 min)              Reviewed S&S of infection, Game ready 10 min, mod compression.    PATIENT EDUCATION:  Education details: Initiated HEP Person educated: Patient Education method: Consulting civil engineer, Media planner, Verbal cues, and Handouts Education comprehension: verbalized understanding, returned  demonstration, and verbal cues required     HOME EXERCISE PROGRAM:  Access Code: [email or web address] URL: [email or web address] Date: 02/23/2022 Prepared by: Candyce Churn  Exercises - Recumbent Bike  - 1 x daily - 7 x weekly - 3 sets - 10 reps - Supine Quadricep Sets  - 1 x daily - 7 x weekly - 3 sets - 10 reps - Small Range  Straight Leg Raise  - 1 x daily - 7 x weekly - 3 sets - 10 reps - Seated Long Arc Quad  - 1 x daily - 7 x weekly - 3 sets - 10 reps - Prone Quadriceps Stretch with Strap  - 1 x daily - 7 x weekly - 3 sets - 10 reps - Squat with Chair Touch  - 1 x daily - 7 x weekly - 3 sets - 10 reps - Side Stepping with Resistance at Feet  - 1 x daily - 7 x weekly - 3 sets - 10 reps - Step Up  - 1 x daily - 7 x weekly - 3 sets - 10 reps - Backward Step Up  - 1 x daily - 7 x weekly - 3 sets - 10 reps - Single Leg Balance with Same Side Arm Star Reach  - 1 x daily - 7 x weekly - 3 sets - 10 reps - Kettlebell Deadlift  - 1 x daily - 7 x weekly - 3 sets - 10 reps - Single Leg Deadlift with Kettlebell  - 1 x daily - 7 x weekly - 3 sets - 10 reps - Single-Leg [place] Deadlift With Dumbbell  - 1 x daily - 7 x weekly - 3 sets - 10 reps - Supine ITB Stretch with Strap  - 1 x daily - 7 x weekly - 1 sets - 3 reps - 30 sec hold - Supine Figure 4 Piriformis Stretch  - 1 x daily - 7 x weekly - 1 sets - 3 reps - 30 sec hold - Supine Piriformis Stretch Pulling Heel to Hip  - 1 x daily - 7 x weekly - 1 sets - 3 reps - 30 sec hold  ASSESSMENT:   CLINICAL IMPRESSION:  Tran continues to progress well with overall function but is struggling with pain control.  He is having some low back issues and feeling like his left LE may be a little weak.  He is concerned due to history of surgery for lumbar disc bulge and laminectomy with left LE radicular symptoms.  We emphasized hip flexibility today as his tightness due to kinetic chain effects is likely contributing to his back issues.  He should respond well to Hip flexibility and strengthening.  He would benefit from skilled PT for post TKA protocol.     OBJECTIVE IMPAIRMENTS Abnormal gait, difficulty walking, decreased ROM, decreased strength, increased muscle spasms, and pain.    ACTIVITY LIMITATIONS carrying, lifting, bending, sitting, standing, squatting, sleeping, stairs,  transfers, and dressing   PARTICIPATION LIMITATIONS: meal prep, cleaning, laundry, driving, shopping, community activity, occupation, and yard work   PERSONAL FACTORS Fitness, Past/current experiences, and Profession are also affecting patient's functional outcome.    REHAB POTENTIAL: Excellent   CLINICAL DECISION MAKING: Stable/uncomplicated   EVALUATION COMPLEXITY: Low     GOALS: Goals reviewed with patient? Yes   SHORT TERM GOALS: Target date: 12/22/2021  Patient will be independent with initial HEP  Baseline: Goal status: Goal met 02/07/22   2.  Pain report to be no greater than 4/10  Baseline:  Goal status: NEW   3.  Left knee flexion AROM to 100 degrees Baseline: 90 Goal status: NEW     LONG TERM GOALS: Target date: 01/19/2022    Patient to be independent with advanced HEP  Baseline:  Goal status: NEW   2.  Patient to report pain no greater than 2/10  Baseline:  Goal status: NEW   3.  Left knee flexion and extension to be within 5 degrees of uninvolved knee Baseline:  Goal status: NEW   4.  MMT to be 5/5 on left knee flexion and extension Baseline:  Goal status: NEW   5.  Patient to be able to ascend and descend steps with reciprocal gait without compensation Baseline:  Goal status: NEW   6.  Normalized gait with good heel to toe progression Baseline:  Goal status: NEW     PLAN: PT FREQUENCY: 3x/week   PT DURATION: 6 weeks   PLANNED INTERVENTIONS: Therapeutic exercises, Therapeutic activity, Neuromuscular re-education, Balance training, Gait training, Patient/Family education, Self Care, Joint mobilization, Stair training, DME instructions, Aquatic Therapy, Dry Needling, Electrical stimulation, Cryotherapy, Moist heat, Compression bandaging, Taping, Vasopneumatic device, Manual therapy, and Re-evaluation   PLAN FOR NEXT SESSION: Hip stretching, quad strength.   Anderson Malta B. Anikah Hogge, PT 02/23/22 5:01 PM   [facility] [address] [place], [place] [address] Phone # [phone] Fax [phone]

## 2022-02-24 ENCOUNTER — Telehealth

## 2022-02-24 MED ORDER — OXYCODONE-ACETAMINOPHEN 5-325 MG PO TABS
5-325 | ORAL_TABLET | ORAL | 0 refills | Status: AC | PRN
Start: 2022-02-24 — End: 2022-03-04

## 2022-02-24 NOTE — Therapy (Signed)
OUTPATIENT PHYSICAL THERAPY TREATMENT NOTE   Patient Name: Bobby House MRN: [id number] DOB:12/07/1960, 61 y.o., male Today's Date: 02/25/2022  PCP: Marius Ditch, MD REFERRING PROVIDER: Derry Skill, MD  END OF SESSION:   PT End of Session - 02/25/22 1050     Visit Number 10    Number of Visits 24    Date for PT Re-Evaluation 04/08/22    Authorization Type BCBS    PT Start Time 1015    PT Stop Time 1100    PT Time Calculation (min) 45 min    Activity Tolerance Patient tolerated treatment well    Behavior During Therapy WFL for tasks assessed/performed               Past Medical History:  Diagnosis Date   ADD (attention deficit disorder)    DDD (degenerative disc disease), lumbar    Hypertension    Prostatitis    Umbilical hernia    Past Surgical History:  Procedure Laterality Date   LUMBAR DISC SURGERY     Patient Active Problem List   Diagnosis Date Noted   Ganglion cyst Left post lat knee 05/28/2021   Primary osteoarthritis of left knee 05/28/2021   Anxiety state, unspecified 01/09/2013   Attention deficit disorder without mention of hyperactivity 01/09/2013   Adjustment disorder with mixed anxiety and depressed mood 09/19/2012    REFERRING DIAG: O12.248 (ICD-10-CM) - Presence of left artificial knee joint   THERAPY DIAG:  Acute pain of right knee  Stiffness of right knee, not elsewhere classified  Difficulty in walking, not elsewhere classified  Muscle weakness (generalized)  Localized edema  Rationale for Evaluation and Treatment Rehabilitation  PERTINENT HISTORY: hx lumbar fusion    PRECAUTIONS: None  SUBJECTIVE:                                                                                                                                                                                      SUBJECTIVE STATEMENT: Continued  left low back pain with mild left LE weakness feeling, right knee still sore and swollen.    PAIN:   Are you having pain? Yes: NPRS scale: 5/10 Pain location: Rt knee and LT low back Pain description: Sore Aggravating factors: Overdoing it Relieving factors: ice   OBJECTIVE: (objective measures completed at initial evaluation unless otherwise dated)  DIAGNOSTIC FINDINGS: na   PATIENT SURVEYS:  FOTO: 44.03%, 74% in 17 visits.    COGNITION:           Overall cognitive status: Within functional limits for tasks assessed  SENSATION: WFL   POSTURE:  Normal.      LOWER EXTREMITY ROM:   Active ROM Right eval Left eval Right 02/07/22  Knee flexion 90  113 100  Knee extension -2 (lacking) 0    (Blank rows = not tested)   LOWER EXTREMITY MMT:            Right LE: not tested today. Left knee generally 5/5.       FUNCTIONAL TESTS:  Initial eval: 5 times sit to stand:  15.68 sec  Timed up and go (TUG): 10.85 sec   GAIT: 12/29/21 Distance walked: 300 ft Assistive device utilized: None Level of assistance: Complete Independence Comments: antalgic   TODAY'S TREATMENT:  02/25/22: Recumbent bike L4 x 5 min, PT present Squats to high/ low table  2 x 10 Supine IT stretch bilaterally 3 x 30 sec each Supine modified quad stretch 2x30 sec each Heel tap at stairs 2x10  Leg press 75# bilat, 2x10  Game ready to right knee x 15 min, mod compression, 3 snowflakes.  02/23/22: Recumbent bike L2 x 8 min, PTA present Sit to stand off from mat table holding 2 x 10 Supine IT stretch bilaterally 3 x 30 sec each Lateral band walks with yellow loop x 3 laps of 10 steps Supine piriformis stretch (attempted several techniques) patient able to do on left but right side had to be done seated 3 x 30 sec Lengthy discussion about pain control for back and knee and how his knee position since surgery will have a kinetic chain effect and may cause some back pain.  We discussed various medications and what has been prescribed vs what he needs to control pain.  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  He feels that he accommodated to the medication from the first knee and is now having to take higher doses for the right knee.  However, MD will not prescribe due to concern over resistance.  Patient has stopped taking all pain meds because the 5/325 mg does not help at all.  He has completed his aspirin regimen and is now able to take Ibuprofen which he feels helps more than the pain medication.   Game ready to right knee x 15 min, mod compression, 3 snowflakes.   02/18/22: Recumbent bike L2 x 8 min, PTA present Sit to stand off from mat table holding 10# KB x 10 Attempted quad stretch putting foot on the low mat: too much discomfort in the knee: pt could do off the end of the mat and in standing at the stairs with LTLE up on 2nd step. 3x 3 breaths each Seated LAQ with 6 lb mat table 2x10 Supine clamshells 20x, then static stretch in supine ER 2 min,  Hamstring stretching at steps 3 x 20 sec right  Standing gastroc/soleus stretching right 5 x 10 sec each Single leg press 70# x10  Game Ready 10 min 3 flakes medium   02/16/22: Recumbent bike L2 x 5 min Sit to stand off from chair x 10 Squat to chair x 10 Supine quad stretch 2x30 sec  Seated LAQ with 5 lb mat table  Seated hip ER with 5 lb mat table  Up and down steps x 3 working on proper/safe sequencing Hamstring stretching at steps 5 x 10 sec right  Standing gastroc/soleus stretching right 5 x 10 sec each Lunges onto BOSU ball Single leg press 70# 2x10  Game Ready 10 min 3 flakes medium compression  in elevation (attempted but machine malfunctioned after approx 7 min)              Reviewed S&S of infection, Game ready 10 min, mod compression.    PATIENT EDUCATION:  Education details: Initiated HEP Person educated: Patient Education method: Consulting civil engineer, Media planner, Verbal cues, and Handouts Education comprehension: verbalized understanding, returned demonstration, and verbal cues  required     HOME EXERCISE PROGRAM:  Access Code: [email or web address] URL: [email or web address] Date: 02/23/2022 Prepared by: Candyce Churn  Exercises - Recumbent Bike  - 1 x daily - 7 x weekly - 3 sets - 10 reps - Supine Quadricep Sets  - 1 x daily - 7 x weekly - 3 sets - 10 reps - Small Range Straight Leg Raise  - 1 x daily - 7 x weekly - 3 sets - 10 reps - Seated Long Arc Quad  - 1 x daily - 7 x weekly - 3 sets - 10 reps - Prone Quadriceps Stretch with Strap  - 1 x daily - 7 x weekly - 3 sets - 10 reps - Squat with Chair Touch  - 1 x daily - 7 x weekly - 3 sets - 10 reps - Side Stepping with Resistance at Feet  - 1 x daily - 7 x weekly - 3 sets - 10 reps - Step Up  - 1 x daily - 7 x weekly - 3 sets - 10 reps - Backward Step Up  - 1 x daily - 7 x weekly - 3 sets - 10 reps - Single Leg Balance with Same Side Arm Star Reach  - 1 x daily - 7 x weekly - 3 sets - 10 reps - Kettlebell Deadlift  - 1 x daily - 7 x weekly - 3 sets - 10 reps - Single Leg Deadlift with Kettlebell  - 1 x daily - 7 x weekly - 3 sets - 10 reps - Single-Leg [place] Deadlift With Dumbbell  - 1 x daily - 7 x weekly - 3 sets - 10 reps - Supine ITB Stretch with Strap  - 1 x daily - 7 x weekly - 1 sets - 3 reps - 30 sec hold - Supine Figure 4 Piriformis Stretch  - 1 x daily - 7 x weekly - 1 sets - 3 reps - 30 sec hold - Supine Piriformis Stretch Pulling Heel to Hip  - 1 x daily - 7 x weekly - 1 sets - 3 reps - 30 sec hold  ASSESSMENT:   CLINICAL IMPRESSION:  [place] continues to progress well with overall function but is struggling with pain control. He continues to report some lower back and R hip pain. Session emphasized hip flexibility and quad strengthening. He responded well to all progressions today and reports a decrease in hip after stretches. He would benefit from skilled PT for post TKA protocol.     OBJECTIVE IMPAIRMENTS Abnormal gait, difficulty walking, decreased ROM, decreased strength, increased  muscle spasms, and pain.    ACTIVITY LIMITATIONS carrying, lifting, bending, sitting, standing, squatting, sleeping, stairs, transfers, and dressing   PARTICIPATION LIMITATIONS: meal prep, cleaning, laundry, driving, shopping, community activity, occupation, and yard work   PERSONAL FACTORS Fitness, Past/current experiences, and Profession are also affecting patient's functional outcome.    REHAB POTENTIAL: Excellent   CLINICAL DECISION MAKING: Stable/uncomplicated   EVALUATION COMPLEXITY: Low     GOALS: Goals reviewed with patient? Yes   SHORT TERM GOALS: Target date: 12/22/2021  Patient will be independent with initial HEP  Baseline: Goal status: Goal met 02/07/22   2.  Pain report to be no greater than 4/10  Baseline:  Goal status: NEW   3.  Left knee flexion AROM to 100 degrees Baseline: 90 Goal status: NEW     LONG TERM GOALS: Target date: 01/19/2022    Patient to be independent with advanced HEP  Baseline:  Goal status: NEW   2.  Patient to report pain no greater than 2/10  Baseline:  Goal status: NEW   3.  Left knee flexion and extension to be within 5 degrees of uninvolved knee Baseline:  Goal status: NEW   4.  MMT to be 5/5 on left knee flexion and extension Baseline:  Goal status: NEW   5.  Patient to be able to ascend and descend steps with reciprocal gait without compensation Baseline:  Goal status: NEW   6.  Normalized gait with good heel to toe progression Baseline:  Goal status: NEW     PLAN: PT FREQUENCY: 3x/week   PT DURATION: 6 weeks   PLANNED INTERVENTIONS: Therapeutic exercises, Therapeutic activity, Neuromuscular re-education, Balance training, Gait training, Patient/Family education, Self Care, Joint mobilization, Stair training, DME instructions, Aquatic Therapy, Dry Needling, Electrical stimulation, Cryotherapy, Moist heat, Compression bandaging, Taping, Vasopneumatic device, Manual therapy, and Re-evaluation   PLAN FOR NEXT  SESSION: Hip stretching, quad strength.   Rudi Heap PT, DPT 02/25/22  10:52 AM   [facility] [address], [place], [place] [address] Phone # [phone] Fax [phone]

## 2022-02-24 NOTE — Telephone Encounter (Signed)
02/01/22 rt tkr    He would like a refill on oxyCODONE-acetaminophen (PERCOCET) 5-325 MG per tablet  Last refill was on 02/16/22 but he had hyrdocodone last time but wants to switch it to oxycodone.    Travis Montgomery [phone] - [place], [place]

## 2022-02-25 ENCOUNTER — Ambulatory Visit: Payer: BC Managed Care – PPO | Admitting: Physical Therapy

## 2022-02-25 DIAGNOSIS — M25661 Stiffness of right knee, not elsewhere classified: Secondary | ICD-10-CM

## 2022-02-25 DIAGNOSIS — M25561 Pain in right knee: Secondary | ICD-10-CM

## 2022-02-25 DIAGNOSIS — R600 Localized edema: Secondary | ICD-10-CM

## 2022-02-25 DIAGNOSIS — M6281 Muscle weakness (generalized): Secondary | ICD-10-CM

## 2022-02-25 DIAGNOSIS — R262 Difficulty in walking, not elsewhere classified: Secondary | ICD-10-CM

## 2022-02-28 ENCOUNTER — Ambulatory Visit: Payer: BC Managed Care – PPO | Admitting: Physical Therapy

## 2022-02-28 NOTE — Therapy (Deleted)
OUTPATIENT PHYSICAL THERAPY TREATMENT NOTE   Patient Name: Bobby House MRN: [id number] DOB:July 08, 1960, 61 y.o., male Today's Date: 02/25/2022  PCP: Marius Ditch, MD REFERRING PROVIDER: Derry Skill, MD  END OF SESSION:   PT End of Session - 02/25/22 1050     Visit Number 10    Number of Visits 24    Date for PT Re-Evaluation 04/08/22    Authorization Type BCBS    PT Start Time 1015    PT Stop Time 1100    PT Time Calculation (min) 45 min    Activity Tolerance Patient tolerated treatment well    Behavior During Therapy WFL for tasks assessed/performed               Past Medical History:  Diagnosis Date   ADD (attention deficit disorder)    DDD (degenerative disc disease), lumbar    Hypertension    Prostatitis    Umbilical hernia    Past Surgical History:  Procedure Laterality Date   LUMBAR DISC SURGERY     Patient Active Problem List   Diagnosis Date Noted   Ganglion cyst Left post lat knee 05/28/2021   Primary osteoarthritis of left knee 05/28/2021   Anxiety state, unspecified 01/09/2013   Attention deficit disorder without mention of hyperactivity 01/09/2013   Adjustment disorder with mixed anxiety and depressed mood 09/19/2012    REFERRING DIAG: O12.248 (ICD-10-CM) - Presence of left artificial knee joint   THERAPY DIAG:  Acute pain of right knee  Stiffness of right knee, not elsewhere classified  Difficulty in walking, not elsewhere classified  Muscle weakness (generalized)  Localized edema  Rationale for Evaluation and Treatment Rehabilitation  PERTINENT HISTORY: hx lumbar fusion    PRECAUTIONS: None  SUBJECTIVE:                                                                                                                                                                                      SUBJECTIVE STATEMENT: Continued  left low back pain with mild left LE weakness feeling, right knee still sore and swollen.    PAIN:   Are you having pain? Yes: NPRS scale: 5/10 Pain location: Rt knee and LT low back Pain description: Sore Aggravating factors: Overdoing it Relieving factors: ice   OBJECTIVE: (objective measures completed at initial evaluation unless otherwise dated)  DIAGNOSTIC FINDINGS: na   PATIENT SURVEYS:  FOTO: 44.03%, 74% in 17 visits.    COGNITION:           Overall cognitive status: Within functional limits for tasks assessed  SENSATION: WFL   POSTURE:  Normal.      LOWER EXTREMITY ROM:   Active ROM Right eval Left eval Right 02/07/22  Knee flexion 90  113 100  Knee extension -2 (lacking) 0    (Blank rows = not tested)   LOWER EXTREMITY MMT:            Right LE: not tested today. Left knee generally 5/5.       FUNCTIONAL TESTS:  Initial eval: 5 times sit to stand:  15.68 sec  Timed up and go (TUG): 10.85 sec   GAIT: 12/29/21 Distance walked: 300 ft Assistive device utilized: None Level of assistance: Complete Independence Comments: antalgic   TODAY'S TREATMENT:  02/25/22: Recumbent bike L4 x 5 min, PT present Squats to high/ low table  2 x 10 Supine IT stretch bilaterally 3 x 30 sec each Supine modified quad stretch 2x30 sec each Heel tap at stairs 2x10  Leg press 75# bilat, 2x10  Game ready to right knee x 15 min, mod compression, 3 snowflakes.  02/23/22: Recumbent bike L2 x 8 min, PTA present Sit to stand off from mat table holding 2 x 10 Supine IT stretch bilaterally 3 x 30 sec each Lateral band walks with yellow loop x 3 laps of 10 steps Supine piriformis stretch (attempted several techniques) patient able to do on left but right side had to be done seated 3 x 30 sec Lengthy discussion about pain control for back and knee and how his knee position since surgery will have a kinetic chain effect and may cause some back pain.  We discussed various medications and what has been prescribed vs what he needs to control pain.  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  He feels that he accommodated to the medication from the first knee and is now having to take higher doses for the right knee.  However, MD will not prescribe due to concern over resistance.  Patient has stopped taking all pain meds because the 5/325 mg does not help at all.  He has completed his aspirin regimen and is now able to take Ibuprofen which he feels helps more than the pain medication.   Game ready to right knee x 15 min, mod compression, 3 snowflakes.   02/18/22: Recumbent bike L2 x 8 min, PTA present Sit to stand off from mat table holding 10# KB x 10 Attempted quad stretch putting foot on the low mat: too much discomfort in the knee: pt could do off the end of the mat and in standing at the stairs with LTLE up on 2nd step. 3x 3 breaths each Seated LAQ with 6 lb mat table 2x10 Supine clamshells 20x, then static stretch in supine ER 2 min,  Hamstring stretching at steps 3 x 20 sec right  Standing gastroc/soleus stretching right 5 x 10 sec each Single leg press 70# x10  Game Ready 10 min 3 flakes medium   02/16/22: Recumbent bike L2 x 5 min Sit to stand off from chair x 10 Squat to chair x 10 Supine quad stretch 2x30 sec  Seated LAQ with 5 lb mat table  Seated hip ER with 5 lb mat table  Up and down steps x 3 working on proper/safe sequencing Hamstring stretching at steps 5 x 10 sec right  Standing gastroc/soleus stretching right 5 x 10 sec each Lunges onto BOSU ball Single leg press 70# 2x10  Game Ready 10 min 3 flakes medium compression   in elevation (attempted but machine malfunctioned after approx 7 min)              Reviewed S&S of infection, Game ready 10 min, mod compression.    PATIENT EDUCATION:  Education details: Initiated HEP Person educated: Patient Education method: Explanation, Demonstration, Verbal cues, and Handouts Education comprehension: verbalized understanding, returned demonstration, and verbal cues  required     HOME EXERCISE PROGRAM:  Access Code: [email or web address] URL: [email or web address] Date: 02/23/2022 Prepared by: Siarah Deleo Fields  Exercises - Recumbent Bike  - 1 x daily - 7 x weekly - 3 sets - 10 reps - Supine Quadricep Sets  - 1 x daily - 7 x weekly - 3 sets - 10 reps - Small Range Straight Leg Raise  - 1 x daily - 7 x weekly - 3 sets - 10 reps - Seated Long Arc Quad  - 1 x daily - 7 x weekly - 3 sets - 10 reps - Prone Quadriceps Stretch with Strap  - 1 x daily - 7 x weekly - 3 sets - 10 reps - Squat with Chair Touch  - 1 x daily - 7 x weekly - 3 sets - 10 reps - Side Stepping with Resistance at Feet  - 1 x daily - 7 x weekly - 3 sets - 10 reps - Step Up  - 1 x daily - 7 x weekly - 3 sets - 10 reps - Backward Step Up  - 1 x daily - 7 x weekly - 3 sets - 10 reps - Single Leg Balance with Same Side Arm Star Reach  - 1 x daily - 7 x weekly - 3 sets - 10 reps - Kettlebell Deadlift  - 1 x daily - 7 x weekly - 3 sets - 10 reps - Single Leg Deadlift with Kettlebell  - 1 x daily - 7 x weekly - 3 sets - 10 reps - Single-Leg Romanian Deadlift With Dumbbell  - 1 x daily - 7 x weekly - 3 sets - 10 reps - Supine ITB Stretch with Strap  - 1 x daily - 7 x weekly - 1 sets - 3 reps - 30 sec hold - Supine Figure 4 Piriformis Stretch  - 1 x daily - 7 x weekly - 1 sets - 3 reps - 30 sec hold - Supine Piriformis Stretch Pulling Heel to Hip  - 1 x daily - 7 x weekly - 1 sets - 3 reps - 30 sec hold  ASSESSMENT:   CLINICAL IMPRESSION:  [place] continues to progress well with overall function but is struggling with pain control. He continues to report some lower back and R hip pain. Session emphasized hip flexibility and quad strengthening. He responded well to all progressions today and reports a decrease in hip after stretches. He would benefit from skilled PT for post TKA protocol.     OBJECTIVE IMPAIRMENTS Abnormal gait, difficulty walking, decreased ROM, decreased strength, increased  muscle spasms, and pain.    ACTIVITY LIMITATIONS carrying, lifting, bending, sitting, standing, squatting, sleeping, stairs, transfers, and dressing   PARTICIPATION LIMITATIONS: meal prep, cleaning, laundry, driving, shopping, community activity, occupation, and yard work   PERSONAL FACTORS Fitness, Past/current experiences, and Profession are also affecting patient's functional outcome.    REHAB POTENTIAL: Excellent   CLINICAL DECISION MAKING: Stable/uncomplicated   EVALUATION COMPLEXITY: Low     GOALS: Goals reviewed with patient? Yes   SHORT TERM GOALS: Target date: 12/22/2021  Patient will be independent with initial HEP    Baseline: Goal status: Goal met 02/07/22   2.  Pain report to be no greater than 4/10  Baseline:  Goal status: NEW   3.  Left knee flexion AROM to 100 degrees Baseline: 90 Goal status: NEW     LONG TERM GOALS: Target date: 01/19/2022    Patient to be independent with advanced HEP  Baseline:  Goal status: NEW   2.  Patient to report pain no greater than 2/10  Baseline:  Goal status: NEW   3.  Left knee flexion and extension to be within 5 degrees of uninvolved knee Baseline:  Goal status: NEW   4.  MMT to be 5/5 on left knee flexion and extension Baseline:  Goal status: NEW   5.  Patient to be able to ascend and descend steps with reciprocal gait without compensation Baseline:  Goal status: NEW   6.  Normalized gait with good heel to toe progression Baseline:  Goal status: NEW     PLAN: PT FREQUENCY: 3x/week   PT DURATION: 6 weeks   PLANNED INTERVENTIONS: Therapeutic exercises, Therapeutic activity, Neuromuscular re-education, Balance training, Gait training, Patient/Family education, Self Care, Joint mobilization, Stair training, DME instructions, Aquatic Therapy, Dry Needling, Electrical stimulation, Cryotherapy, Moist heat, Compression bandaging, Taping, Vasopneumatic device, Manual therapy, and Re-evaluation   PLAN FOR NEXT  SESSION: Hip stretching, quad strength.   Simone Yuds PT, DPT 02/25/22  10:52 AM   [facility] [address], [address] [place], [place] [address] Phone # [phone] Fax [phone]        

## 2022-03-01 MED ORDER — CEPHALEXIN 500 MG PO CAPS
500 | ORAL_CAPSULE | Freq: Four times a day (QID) | ORAL | 0 refills | Status: AC
Start: 2022-03-01 — End: 2022-03-08

## 2022-03-01 NOTE — Telephone Encounter (Signed)
From: Garlen E Gerding  To: Anton Cheramie Annalies Daron Breeding  Sent: 03/01/2022 5:12 AM EST  Subject: Suture redness    Travis Montgomery, I hope you are well. Attached is a picture of my suture line that is red, like it is trying to spit a stitch again. Is this something that needs to be treated again? Please let me know.   Thank you.  Travis Montgomery

## 2022-03-02 ENCOUNTER — Ambulatory Visit: Payer: BC Managed Care – PPO

## 2022-03-02 DIAGNOSIS — M25561 Pain in right knee: Secondary | ICD-10-CM | POA: Diagnosis not present

## 2022-03-02 DIAGNOSIS — R262 Difficulty in walking, not elsewhere classified: Secondary | ICD-10-CM

## 2022-03-02 DIAGNOSIS — M6281 Muscle weakness (generalized): Secondary | ICD-10-CM

## 2022-03-02 DIAGNOSIS — M25661 Stiffness of right knee, not elsewhere classified: Secondary | ICD-10-CM

## 2022-03-02 NOTE — Therapy (Signed)
OUTPATIENT PHYSICAL THERAPY TREATMENT NOTE   Patient Name: Bobby House MRN: [id number] DOB:11/17/1960, 61 y.o., male Today's Date: 03/02/2022  PCP: Marius Ditch, MD REFERRING PROVIDER: Derry Skill, MD  END OF SESSION:   PT End of Session - 03/02/22 1128     Visit Number 11    Number of Visits 24    Date for PT Re-Evaluation 04/08/22    Authorization Type BCBS    PT Start Time 1100    PT Stop Time 1150    PT Time Calculation (min) 50 min    Activity Tolerance Patient tolerated treatment well    Behavior During Therapy WFL for tasks assessed/performed               Past Medical History:  Diagnosis Date   ADD (attention deficit disorder)    DDD (degenerative disc disease), lumbar    Hypertension    Prostatitis    Umbilical hernia    Past Surgical History:  Procedure Laterality Date   LUMBAR DISC SURGERY     Patient Active Problem List   Diagnosis Date Noted   Ganglion cyst Left post lat knee 05/28/2021   Primary osteoarthritis of left knee 05/28/2021   Anxiety state, unspecified 01/09/2013   Attention deficit disorder without mention of hyperactivity 01/09/2013   Adjustment disorder with mixed anxiety and depressed mood 09/19/2012    REFERRING DIAG: Z99.357 (ICD-10-CM) - Presence of left artificial knee joint   THERAPY DIAG:  Acute pain of right knee  Stiffness of right knee, not elsewhere classified  Difficulty in walking, not elsewhere classified  Muscle weakness (generalized)  Rationale for Evaluation and Treatment Rehabilitation  PERTINENT HISTORY: hx lumbar fusion    PRECAUTIONS: None  SUBJECTIVE:                                                                                                                                                                                      SUBJECTIVE STATEMENT: Continued  left low back pain with mild left LE weakness feeling, right knee still sore and swollen.    PAIN:  Are you having  pain? Yes: NPRS scale: 5/10 Pain location: Rt knee and LT low back Pain description: Sore Aggravating factors: Overdoing it Relieving factors: ice   OBJECTIVE: (objective measures completed at initial evaluation unless otherwise dated)  DIAGNOSTIC FINDINGS: na   PATIENT SURVEYS:  FOTO: 44.03%, 74% in 17 visits.    COGNITION:           Overall cognitive status: Within functional limits for tasks assessed  SENSATION: WFL   POSTURE:  Normal.      LOWER EXTREMITY ROM:   Active ROM Right eval Left eval Right 02/07/22  Knee flexion 90  113 100  Knee extension -2 (lacking) 0    (Blank rows = not tested)   LOWER EXTREMITY MMT:            Right LE: not tested today. Left knee generally 5/5.       FUNCTIONAL TESTS:  Initial eval: 5 times sit to stand:  15.68 sec  Timed up and go (TUG): 10.85 sec   GAIT: 12/29/21 Distance walked: 300 ft Assistive device utilized: None Level of assistance: Complete Independence Comments: antalgic   TODAY'S TREATMENT:  03/02/22 Recumbent bike x 5 min (PT present to discuss status) Sit to stand x 10 with emphasis on equal weight shift (patient with audible clicking/popping in left knee which was replaced recently) Assessed popping in left knee and educated patient on the probable cause being the IT band due to the extent of his varus deformity and the correction post surgically.   Supine IT band stretch bilaterally 3 x 30 sec Supine quad progression: quad set, TKE with small noodle, TKE with larger noodle, TKE with white foam roller, SAQ with larger blue bolster, SLR all with 5 lb and 20 reps each Educated on need to be diligent with IT band stretches Game ready x 15 min to right knee, mod compression with 3 snowflakes  02/25/22: Recumbent bike L4 x 5 min, PT present Squats to high/ low table  2 x 10 Supine IT stretch bilaterally 3 x 30 sec each Supine modified quad stretch 2x30 sec each Heel tap at stairs 2x10   Leg press 75# bilat, 2x10  Game ready to right knee x 15 min, mod compression, 3 snowflakes.  02/23/22: Recumbent bike L2 x 8 min, PTA present Sit to stand off from mat table holding 2 x 10 Supine IT stretch bilaterally 3 x 30 sec each Lateral band walks with yellow loop x 3 laps of 10 steps Supine piriformis stretch (attempted several techniques) patient able to do on left but right side had to be done seated 3 x 30 sec Lengthy discussion about pain control for back and knee and how his knee position since surgery will have a kinetic chain effect and may cause some back pain.  We discussed various medications and what has been prescribed vs what he needs to control pain.  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  He feels that he accommodated to the medication from the first knee and is now having to take higher doses for the right knee.  However, MD will not prescribe due to concern over resistance.  Patient has stopped taking all pain meds because the 5/325 mg does not help at all.  He has completed his aspirin regimen and is now able to take Ibuprofen which he feels helps more than the pain medication.   Game ready to right knee x 15 min, mod compression, 3 snowflakes.               Reviewed S&S of infection, Game ready 10 min, mod compression.    PATIENT EDUCATION:  Education details: Initiated HEP Person educated: Patient Education method: Consulting civil engineer, Media planner, Verbal cues, and Handouts Education comprehension: verbalized understanding, returned demonstration, and verbal cues required     HOME EXERCISE PROGRAM:  Access Code: [email or web address] URL: [email or web address] Date: 02/23/2022 Prepared by: Candyce Churn  Exercises - Recumbent  Bike  - 1 x daily - 7 x weekly - 3 sets - 10 reps - Supine Quadricep Sets  - 1 x daily - 7 x weekly - 3 sets - 10 reps - Small Range Straight Leg Raise  - 1 x daily - 7 x weekly - 3 sets - 10 reps - Seated Long  Arc Quad  - 1 x daily - 7 x weekly - 3 sets - 10 reps - Prone Quadriceps Stretch with Strap  - 1 x daily - 7 x weekly - 3 sets - 10 reps - Squat with Chair Touch  - 1 x daily - 7 x weekly - 3 sets - 10 reps - Side Stepping with Resistance at Feet  - 1 x daily - 7 x weekly - 3 sets - 10 reps - Step Up  - 1 x daily - 7 x weekly - 3 sets - 10 reps - Backward Step Up  - 1 x daily - 7 x weekly - 3 sets - 10 reps - Single Leg Balance with Same Side Arm Star Reach  - 1 x daily - 7 x weekly - 3 sets - 10 reps - Kettlebell Deadlift  - 1 x daily - 7 x weekly - 3 sets - 10 reps - Single Leg Deadlift with Kettlebell  - 1 x daily - 7 x weekly - 3 sets - 10 reps - Single-Leg [place] Deadlift With Dumbbell  - 1 x daily - 7 x weekly - 3 sets - 10 reps - Supine ITB Stretch with Strap  - 1 x daily - 7 x weekly - 1 sets - 3 reps - 30 sec hold - Supine Figure 4 Piriformis Stretch  - 1 x daily - 7 x weekly - 1 sets - 3 reps - 30 sec hold - Supine Piriformis Stretch Pulling Heel to Hip  - 1 x daily - 7 x weekly - 1 sets - 3 reps - 30 sec hold  ASSESSMENT:   CLINICAL IMPRESSION:  Edgar is progressing appropriately.  He is having some issues due to kinetic chain changes post surgically.  He has good ROM and good quad activity.  He should continue to do well if compliant with HEP.   He would benefit from skilled PT for post TKA protocol.     OBJECTIVE IMPAIRMENTS Abnormal gait, difficulty walking, decreased ROM, decreased strength, increased muscle spasms, and pain.    ACTIVITY LIMITATIONS carrying, lifting, bending, sitting, standing, squatting, sleeping, stairs, transfers, and dressing   PARTICIPATION LIMITATIONS: meal prep, cleaning, laundry, driving, shopping, community activity, occupation, and yard work   PERSONAL FACTORS Fitness, Past/current experiences, and Profession are also affecting patient's functional outcome.    REHAB POTENTIAL: Excellent   CLINICAL DECISION MAKING: Stable/uncomplicated    EVALUATION COMPLEXITY: Low     GOALS: Goals reviewed with patient? Yes   SHORT TERM GOALS: Target date: 12/22/2021  Patient will be independent with initial HEP  Baseline: Goal status: Goal met 02/07/22   2.  Pain report to be no greater than 4/10  Baseline:  Goal status: NEW   3.  Left knee flexion AROM to 100 degrees Baseline: 90 Goal status: NEW     LONG TERM GOALS: Target date: 01/19/2022    Patient to be independent with advanced HEP  Baseline:  Goal status: NEW   2.  Patient to report pain no greater than 2/10  Baseline:  Goal status: NEW   3.  Left knee flexion and extension to  be within 5 degrees of uninvolved knee Baseline:  Goal status: NEW   4.  MMT to be 5/5 on left knee flexion and extension Baseline:  Goal status: NEW   5.  Patient to be able to ascend and descend steps with reciprocal gait without compensation Baseline:  Goal status: NEW   6.  Normalized gait with good heel to toe progression Baseline:  Goal status: NEW     PLAN: PT FREQUENCY: 3x/week   PT DURATION: 6 weeks   PLANNED INTERVENTIONS: Therapeutic exercises, Therapeutic activity, Neuromuscular re-education, Balance training, Gait training, Patient/Family education, Self Care, Joint mobilization, Stair training, DME instructions, Aquatic Therapy, Dry Needling, Electrical stimulation, Cryotherapy, Moist heat, Compression bandaging, Taping, Vasopneumatic device, Manual therapy, and Re-evaluation   PLAN FOR NEXT SESSION: Hip stretching, quad strength.   Anderson Malta B. Terriona Horlacher, PT 03/02/22 1:03 PM    [facility] [address] [place], [place] [address] Phone # [phone] Fax [phone]

## 2022-03-04 ENCOUNTER — Ambulatory Visit: Payer: BC Managed Care – PPO

## 2022-03-06 NOTE — Therapy (Unsigned)
OUTPATIENT PHYSICAL THERAPY TREATMENT NOTE   Patient Name: Bobby House MRN: [id number] DOB:23-Apr-1960, 61 y.o., male Today's Date: 03/07/2022  PCP: Marius Ditch, MD REFERRING PROVIDER: Derry Skill, MD  END OF SESSION:   PT End of Session - 03/07/22 1148     Visit Number 12    Number of Visits 24    Date for PT Re-Evaluation 04/08/22    Authorization Type BCBS    PT Start Time 1147    PT Stop Time 1240    PT Time Calculation (min) 53 min    Activity Tolerance Patient tolerated treatment well    Behavior During Therapy WFL for tasks assessed/performed                Past Medical History:  Diagnosis Date   ADD (attention deficit disorder)    DDD (degenerative disc disease), lumbar    Hypertension    Prostatitis    Umbilical hernia    Past Surgical History:  Procedure Laterality Date   LUMBAR DISC SURGERY     Patient Active Problem List   Diagnosis Date Noted   Ganglion cyst Left post lat knee 05/28/2021   Primary osteoarthritis of left knee 05/28/2021   Anxiety state, unspecified 01/09/2013   Attention deficit disorder without mention of hyperactivity 01/09/2013   Adjustment disorder with mixed anxiety and depressed mood 09/19/2012    REFERRING DIAG: W54.627 (ICD-10-CM) - Presence of left artificial knee joint   THERAPY DIAG:  Acute pain of right knee  Stiffness of right knee, not elsewhere classified  Muscle weakness (generalized)  Difficulty in walking, not elsewhere classified  Localized edema  Rationale for Evaluation and Treatment Rehabilitation  PERTINENT HISTORY: hx lumbar fusion    PRECAUTIONS: None  SUBJECTIVE:                                                                                                                                                                                      SUBJECTIVE STATEMENT: Everything is better. Doing my stretches  PAIN:  Are you having pain? Yes: NPRS scale: 2/10 Pain location: Rt  knee and LT low back Pain description: Sore Aggravating factors: Overdoing it Relieving factors: ice   OBJECTIVE: (objective measures completed at initial evaluation unless otherwise dated)  DIAGNOSTIC FINDINGS: na   PATIENT SURVEYS:  FOTO: 44.03%, 74% in 17 visits.    COGNITION:           Overall cognitive status: Within functional limits for tasks assessed                          SENSATION: WFL   POSTURE:  Normal.      LOWER EXTREMITY ROM:   Active ROM Right eval Left eval Right 02/07/22  Knee flexion 90  113 100  Knee extension -2 (lacking) 0    (Blank rows = not tested)   LOWER EXTREMITY MMT:            Right LE: not tested today. Left knee generally 5/5.       FUNCTIONAL TESTS:  Initial eval: 5 times sit to stand:  15.68 sec  Timed up and go (TUG): 10.85 sec   GAIT: 12/29/21 Distance walked: 300 ft Assistive device utilized: None Level of assistance: Complete Independence Comments: antalgic   TODAY'S TREATMENT:   03/07/22:  Recumbent bike L3 x 10 min with PTA present to discuss current status LAQ: 6# 3x10 Sit to stand with 10# KB 2x15 Leg press: RTLE 75# 3x10 30# reverse walking 10x Red loop at ankles sidestepping 3x10 feet Game ready x 15 min to right knee, mod compression with 3 snowflakes    03/02/22 Recumbent bike x 5 min (PT present to discuss status) Sit to stand x 10 with emphasis on equal weight shift (patient with audible clicking/popping in left knee which was replaced recently) Assessed popping in left knee and educated patient on the probable cause being the IT band due to the extent of his varus deformity and the correction post surgically.   Supine IT band stretch bilaterally 3 x 30 sec Supine quad progression: quad set, TKE with small noodle, TKE with larger noodle, TKE with white foam roller, SAQ with larger blue bolster, SLR all with 5 lb and 20 reps each Educated on need to be diligent with IT band stretches Game ready x 15  min to right knee, mod compression with 3 snowflakes  02/25/22: Recumbent bike L4 x 5 min, PT present Squats to high/ low table  2 x 10 Supine IT stretch bilaterally 3 x 30 sec each Supine modified quad stretch 2x30 sec each Heel tap at stairs 2x10  Leg press 75# bilat, 2x10  Game ready to right knee x 15 min, mod compression, 3 snowflakes.               Reviewed S&S of infection, Game ready 10 min, mod compression.    PATIENT EDUCATION:  Education details: Initiated HEP Person educated: Patient Education method: Consulting civil engineer, Media planner, Verbal cues, and Handouts Education comprehension: verbalized understanding, returned demonstration, and verbal cues required     HOME EXERCISE PROGRAM:  Access Code: [email or web address] URL: [email or web address] Date: 02/23/2022 Prepared by: Candyce Churn  Exercises - Recumbent Bike  - 1 x daily - 7 x weekly - 3 sets - 10 reps - Supine Quadricep Sets  - 1 x daily - 7 x weekly - 3 sets - 10 reps - Small Range Straight Leg Raise  - 1 x daily - 7 x weekly - 3 sets - 10 reps - Seated Long Arc Quad  - 1 x daily - 7 x weekly - 3 sets - 10 reps - Prone Quadriceps Stretch with Strap  - 1 x daily - 7 x weekly - 3 sets - 10 reps - Squat with Chair Touch  - 1 x daily - 7 x weekly - 3 sets - 10 reps - Side Stepping with Resistance at Feet  - 1 x daily - 7 x weekly - 3 sets - 10 reps - Step Up  - 1 x daily - 7 x weekly - 3 sets - 10 reps - Backward Step  Up  - 1 x daily - 7 x weekly - 3 sets - 10 reps - Single Leg Balance with Same Side Arm Star Reach  - 1 x daily - 7 x weekly - 3 sets - 10 reps - Kettlebell Deadlift  - 1 x daily - 7 x weekly - 3 sets - 10 reps - Single Leg Deadlift with Kettlebell  - 1 x daily - 7 x weekly - 3 sets - 10 reps - Single-Leg [place] Deadlift With Dumbbell  - 1 x daily - 7 x weekly - 3 sets - 10 reps - Supine ITB Stretch with Strap  - 1 x daily - 7 x weekly - 1 sets - 3 reps - 30 sec hold - Supine Figure 4  Piriformis Stretch  - 1 x daily - 7 x weekly - 1 sets - 3 reps - 30 sec hold - Supine Piriformis Stretch Pulling Heel to Hip  - 1 x daily - 7 x weekly - 1 sets - 3 reps - 30 sec hold  ASSESSMENT:   CLINICAL IMPRESSION:  Graysyn is progressing appropriately. Pain is abolishing in his knee, hip and back. Edema remains in the Rt knee but pt was able to perform all his exercises well and pain free.    OBJECTIVE IMPAIRMENTS Abnormal gait, difficulty walking, decreased ROM, decreased strength, increased muscle spasms, and pain.    ACTIVITY LIMITATIONS carrying, lifting, bending, sitting, standing, squatting, sleeping, stairs, transfers, and dressing   PARTICIPATION LIMITATIONS: meal prep, cleaning, laundry, driving, shopping, community activity, occupation, and yard work   PERSONAL FACTORS Fitness, Past/current experiences, and Profession are also affecting patient's functional outcome.    REHAB POTENTIAL: Excellent   CLINICAL DECISION MAKING: Stable/uncomplicated   EVALUATION COMPLEXITY: Low     GOALS: Goals reviewed with patient? Yes   SHORT TERM GOALS: Target date: 12/22/2021  Patient will be independent with initial HEP  Baseline: Goal status: Goal met 02/07/22   2.  Pain report to be no greater than 4/10  Baseline:  Goal status: NEW   3.  Left knee flexion AROM to 100 degrees Baseline: 90 Goal status: NEW     LONG TERM GOALS: Target date: 01/19/2022    Patient to be independent with advanced HEP  Baseline:  Goal status: NEW   2.  Patient to report pain no greater than 2/10  Baseline:  Goal status: NEW   3.  Left knee flexion and extension to be within 5 degrees of uninvolved knee Baseline:  Goal status: NEW   4.  MMT to be 5/5 on left knee flexion and extension Baseline:  Goal status: NEW   5.  Patient to be able to ascend and descend steps with reciprocal gait without compensation Baseline:  Goal status: NEW   6.  Normalized gait with good heel to toe  progression Baseline:  Goal status: NEW     PLAN: PT FREQUENCY: 3x/week   PT DURATION: 6 weeks   PLANNED INTERVENTIONS: Therapeutic exercises, Therapeutic activity, Neuromuscular re-education, Balance training, Gait training, Patient/Family education, Self Care, Joint mobilization, Stair training, DME instructions, Aquatic Therapy, Dry Needling, Electrical stimulation, Cryotherapy, Moist heat, Compression bandaging, Taping, Vasopneumatic device, Manual therapy, and Re-evaluation   PLAN FOR NEXT SESSION: Hip stretching, quad strength.   Myrene Galas, PTA 03/07/22 12:25 PM    [facility] [address] [place], [place] [address] Phone # [phone] Fax [phone]

## 2022-03-07 ENCOUNTER — Encounter: Payer: Self-pay | Admitting: Physical Therapy

## 2022-03-07 ENCOUNTER — Ambulatory Visit: Payer: BC Managed Care – PPO | Admitting: Physical Therapy

## 2022-03-07 DIAGNOSIS — M25561 Pain in right knee: Secondary | ICD-10-CM | POA: Diagnosis not present

## 2022-03-07 DIAGNOSIS — R262 Difficulty in walking, not elsewhere classified: Secondary | ICD-10-CM

## 2022-03-07 DIAGNOSIS — R600 Localized edema: Secondary | ICD-10-CM

## 2022-03-07 DIAGNOSIS — M6281 Muscle weakness (generalized): Secondary | ICD-10-CM

## 2022-03-07 DIAGNOSIS — M25661 Stiffness of right knee, not elsewhere classified: Secondary | ICD-10-CM

## 2022-03-09 ENCOUNTER — Ambulatory Visit: Payer: BC Managed Care – PPO

## 2022-03-09 DIAGNOSIS — M25561 Pain in right knee: Secondary | ICD-10-CM | POA: Diagnosis not present

## 2022-03-09 DIAGNOSIS — R262 Difficulty in walking, not elsewhere classified: Secondary | ICD-10-CM

## 2022-03-09 DIAGNOSIS — M6281 Muscle weakness (generalized): Secondary | ICD-10-CM

## 2022-03-09 DIAGNOSIS — M25661 Stiffness of right knee, not elsewhere classified: Secondary | ICD-10-CM

## 2022-03-09 NOTE — Therapy (Signed)
OUTPATIENT PHYSICAL THERAPY TREATMENT NOTE   Patient Name: Bobby House MRN: [id number] DOB:01/28/1961, 61 y.o., male Today's Date: 03/09/2022  PCP: Osborne, James C, MD REFERRING PROVIDER: Patel, Manish A, MD  END OF SESSION:   PT End of Session - 03/09/22 1159     Visit Number 13    Number of Visits 24    Date for PT Re-Evaluation 04/08/22    Authorization Type BCBS    PT Start Time 1155    PT Stop Time 1230    PT Time Calculation (min) 35 min    Activity Tolerance Patient tolerated treatment well    Behavior During Therapy WFL for tasks assessed/performed                Past Medical History:  Diagnosis Date   ADD (attention deficit disorder)    DDD (degenerative disc disease), lumbar    Hypertension    Prostatitis    Umbilical hernia    Past Surgical History:  Procedure Laterality Date   LUMBAR DISC SURGERY     Patient Active Problem List   Diagnosis Date Noted   Ganglion cyst Left post lat knee 05/28/2021   Primary osteoarthritis of left knee 05/28/2021   Anxiety state, unspecified 01/09/2013   Attention deficit disorder without mention of hyperactivity 01/09/2013   Adjustment disorder with mixed anxiety and depressed mood 09/19/2012    REFERRING DIAG: Z96.652 (ICD-10-CM) - Presence of left artificial knee joint   THERAPY DIAG:  Acute pain of right knee  Stiffness of right knee, not elsewhere classified  Muscle weakness (generalized)  Difficulty in walking, not elsewhere classified  Rationale for Evaluation and Treatment Rehabilitation  PERTINENT HISTORY: hx lumbar fusion    PRECAUTIONS: None  SUBJECTIVE:                                                                                                                                                                                      SUBJECTIVE STATEMENT: Everything is better. Doing my stretches  PAIN:  Are you having pain? Yes: NPRS scale: 2/10 Pain location: Rt knee and LT low  back Pain description: Sore Aggravating factors: Overdoing it Relieving factors: ice   OBJECTIVE: (objective measures completed at initial evaluation unless otherwise dated)  DIAGNOSTIC FINDINGS: na   PATIENT SURVEYS:  FOTO: 44.03%, 74% in 17 visits.    COGNITION:           Overall cognitive status: Within functional limits for tasks assessed                          SENSATION: WFL   POSTURE:  Normal.        LOWER EXTREMITY ROM:   Active ROM Right eval Left eval Right 02/07/22  Knee flexion 90  113 100  Knee extension -2 (lacking) 0    (Blank rows = not tested)   LOWER EXTREMITY MMT:            Right LE: not tested today. Left knee generally 5/5.       FUNCTIONAL TESTS:  Initial eval: 5 times sit to stand:  15.68 sec  Timed up and go (TUG): 10.85 sec   GAIT: 12/29/21 Distance walked: 300 ft Assistive device utilized: None Level of assistance: Complete Independence Comments: antalgic   TODAY'S TREATMENT:  03/09/22:  (Patient 12 min late) Recumbent bike L3 x 5 min with PTA present to discuss current status LAQ: 6# 3x10 Sit to stand with 10# KB 2x15 Leg press: RTLE 90# 3x10 Multi-hip 70# hip abd and ext 2 x 10 both IT band rolling for both LE's as patient is tight and compensating for left IT band tightness- he is also getting some audible popping at left lateral patella Attempted sit to stand again after rolling, Patient had one pop then no more for total of 10 reps Patellar mobilization bilaterally for lateral tracking issues due to new alignment post surgery.  Cross friction to left lateral retinaculum.    03/07/22:  Recumbent bike L3 x 10 min with PTA present to discuss current status LAQ: 6# 3x10 Sit to stand with 10# KB 2x15 Leg press: RTLE 75# 3x10 30# reverse walking 10x Red loop at ankles sidestepping 3x10 feet Game ready x 15 min to right knee, mod compression with 3 snowflakes    03/02/22 Recumbent bike x 5 min (PT present to discuss  status) Sit to stand x 10 with emphasis on equal weight shift (patient with audible clicking/popping in left knee which was replaced recently) Assessed popping in left knee and educated patient on the probable cause being the IT band due to the extent of his varus deformity and the correction post surgically.   Supine IT band stretch bilaterally 3 x 30 sec Supine quad progression: quad set, TKE with small noodle, TKE with larger noodle, TKE with white foam roller, SAQ with larger blue bolster, SLR all with 5 lb and 20 reps each Educated on need to be diligent with IT band stretches Game ready x 15 min to right knee, mod compression with 3 snowflakes  02/25/22: Recumbent bike L4 x 5 min, PT present Squats to high/ low table  2 x 10 Supine IT stretch bilaterally 3 x 30 sec each Supine modified quad stretch 2x30 sec each Heel tap at stairs 2x10  Leg press 75# bilat, 2x10  Game ready to right knee x 15 min, mod compression, 3 snowflakes.               Reviewed S&S of infection, Game ready 10 min, mod compression.    PATIENT EDUCATION:  Education details: Initiated HEP Person educated: Patient Education method: Consulting civil engineer, Media planner, Verbal cues, and Handouts Education comprehension: verbalized understanding, returned demonstration, and verbal cues required     HOME EXERCISE PROGRAM:  Access Code: [email or web address] URL: [email or web address] Date: 02/23/2022 Prepared by: Candyce Churn  Exercises - Recumbent Bike  - 1 x daily - 7 x weekly - 3 sets - 10 reps - Supine Quadricep Sets  - 1 x daily - 7 x weekly - 3 sets - 10 reps - Small Range Straight Leg Raise  - 1 x daily - 7 x weekly - 3 sets -  10 reps - Seated Long Arc Quad  - 1 x daily - 7 x weekly - 3 sets - 10 reps - Prone Quadriceps Stretch with Strap  - 1 x daily - 7 x weekly - 3 sets - 10 reps - Squat with Chair Touch  - 1 x daily - 7 x weekly - 3 sets - 10 reps - Side Stepping with Resistance at Feet  - 1 x daily  - 7 x weekly - 3 sets - 10 reps - Step Up  - 1 x daily - 7 x weekly - 3 sets - 10 reps - Backward Step Up  - 1 x daily - 7 x weekly - 3 sets - 10 reps - Single Leg Balance with Same Side Arm Star Reach  - 1 x daily - 7 x weekly - 3 sets - 10 reps - Kettlebell Deadlift  - 1 x daily - 7 x weekly - 3 sets - 10 reps - Single Leg Deadlift with Kettlebell  - 1 x daily - 7 x weekly - 3 sets - 10 reps - Single-Leg Romanian Deadlift With Dumbbell  - 1 x daily - 7 x weekly - 3 sets - 10 reps - Supine ITB Stretch with Strap  - 1 x daily - 7 x weekly - 1 sets - 3 reps - 30 sec hold - Supine Figure 4 Piriformis Stretch  - 1 x daily - 7 x weekly - 1 sets - 3 reps - 30 sec hold - Supine Piriformis Stretch Pulling Heel to Hip  - 1 x daily - 7 x weekly - 1 sets - 3 reps - 30 sec hold  ASSESSMENT:   CLINICAL IMPRESSION:  Bobby House continues to experience left patellar clicking/popping laterally which is affecting the outcome of his right knee.  This is believed to be from the new position and alignment post surgically and the close approximation of his two surgeries.  He understands need to roll both IT bands and to focus on gaining full quad strength including return of VMO.  He would benefit from continuing skilled PT for restoring proper quad strength and mobility post surgically.     OBJECTIVE IMPAIRMENTS Abnormal gait, difficulty walking, decreased ROM, decreased strength, increased muscle spasms, and pain.    ACTIVITY LIMITATIONS carrying, lifting, bending, sitting, standing, squatting, sleeping, stairs, transfers, and dressing   PARTICIPATION LIMITATIONS: meal prep, cleaning, laundry, driving, shopping, community activity, occupation, and yard work   PERSONAL FACTORS Fitness, Past/current experiences, and Profession are also affecting patient's functional outcome.    REHAB POTENTIAL: Excellent   CLINICAL DECISION MAKING: Stable/uncomplicated   EVALUATION COMPLEXITY: Low     GOALS: Goals reviewed  with patient? Yes   SHORT TERM GOALS: Target date: 12/22/2021  Patient will be independent with initial HEP  Baseline: Goal status: Goal met 02/07/22   2.  Pain report to be no greater than 4/10  Baseline:  Goal status: PROGRESSING   3.  Left knee flexion AROM to 100 degrees Baseline: 90 Goal status: MET 03/09/22     LONG TERM GOALS: Target date: 04/08/2022    Patient to be independent with advanced HEP  Baseline:  Goal status: NEW   2.  Patient to report pain no greater than 2/10  Baseline:  Goal status: NEW   3.  Left knee flexion and extension to be within 5 degrees of uninvolved knee Baseline:  Goal status: NEW   4.  MMT to be 5/5 on left knee flexion and   extension Baseline:  Goal status: NEW   5.  Patient to be able to ascend and descend steps with reciprocal gait without compensation Baseline:  Goal status: NEW   6.  Normalized gait with good heel to toe progression Baseline:  Goal status: NEW     PLAN: PT FREQUENCY: 3x/week   PT DURATION: 6 weeks   PLANNED INTERVENTIONS: Therapeutic exercises, Therapeutic activity, Neuromuscular re-education, Balance training, Gait training, Patient/Family education, Self Care, Joint mobilization, Stair training, DME instructions, Aquatic Therapy, Dry Needling, Electrical stimulation, Cryotherapy, Moist heat, Compression bandaging, Taping, Vasopneumatic device, Manual therapy, and Re-evaluation   PLAN FOR NEXT SESSION: Hip stretching, quad strength, IT band stretching and rolling, Patellar mobs bilaterally,     Kagan Mutchler B. Wavie Hashimi, PT 03/09/22 12:47 PM    [facility] [address] [place], [place] [address] Phone # [phone] Fax [phone]

## 2022-03-13 NOTE — Therapy (Signed)
OUTPATIENT PHYSICAL THERAPY TREATMENT NOTE   Patient Name: Bobby House MRN: [id number] DOB:1960-09-07, 61 y.o., male Today's Date: 03/14/2022  PCP: Marius Ditch, MD REFERRING PROVIDER: Derry Skill, MD  END OF SESSION:   PT End of Session - 03/14/22 1154     Visit Number 14    Number of Visits 24    Date for PT Re-Evaluation 04/08/22    Authorization Type BCBS    PT Start Time 1153    Activity Tolerance Patient tolerated treatment well    Behavior During Therapy [facility] for tasks assessed/performed                 Past Medical History:  Diagnosis Date   ADD (attention deficit disorder)    DDD (degenerative disc disease), lumbar    Hypertension    Prostatitis    Umbilical hernia    Past Surgical History:  Procedure Laterality Date   LUMBAR DISC SURGERY     Patient Active Problem List   Diagnosis Date Noted   Ganglion cyst Left post lat knee 05/28/2021   Primary osteoarthritis of left knee 05/28/2021   Anxiety state, unspecified 01/09/2013   Attention deficit disorder without mention of hyperactivity 01/09/2013   Adjustment disorder with mixed anxiety and depressed mood 09/19/2012    REFERRING DIAG: I71.245 (ICD-10-CM) - Presence of left artificial knee joint   THERAPY DIAG:  Acute pain of right knee  Stiffness of right knee, not elsewhere classified  Difficulty in walking, not elsewhere classified  Localized edema  Muscle weakness (generalized)  Rationale for Evaluation and Treatment Rehabilitation  PERTINENT HISTORY: hx lumbar fusion    PRECAUTIONS: None  SUBJECTIVE:                                                                                                                                                                                      SUBJECTIVE STATEMENT: Just tired from helping take care of my sister. I tried running with my grand kids over the weekend, more Lt sided symptoms.   PAIN:  Are you having pain? Yes: NPRS  scale: 2/10 Pain location: Rt knee and LT low back Pain description: Sore Aggravating factors: Overdoing it Relieving factors: ice   OBJECTIVE: (objective measures completed at initial evaluation unless otherwise dated)  DIAGNOSTIC FINDINGS: na   PATIENT SURVEYS:  FOTO: 44.03%, 74% in 17 visits.    COGNITION:           Overall cognitive status: Within functional limits for tasks assessed                          SENSATION: [facility]  POSTURE:  Normal.      LOWER EXTREMITY ROM:   Active ROM Right eval Left eval Right 02/07/22  Knee flexion 90  113 100  Knee extension -2 (lacking) 0    (Blank rows = not tested)   LOWER EXTREMITY MMT:            Right LE: not tested today. Left knee generally 5/5.       FUNCTIONAL TESTS:  Initial eval: 5 times sit to stand:  15.68 sec  Timed up and go (TUG): 10.85 sec   GAIT: 12/29/21 Distance walked: 300 ft Assistive device utilized: None Level of assistance: Complete Independence Comments: antalgic   TODAY'S TREATMENT:   03/14/22: Recumbent bike L3 x 10 min with PTA present to discuss current status LAQ 7.5# 3x10 Seated adductor squeeze 20x 5 sec hold Leg press: RTLE 90# 10x, 95# 10x,2 Multi-hip 70# hip abd and ext 2 x 10 both: VC to lift LE vs throw Resisted Cable 4 ways 30# 10x each  Game Ready 10 min post medium compression  03/09/22:  (Patient 12 min late) Recumbent bike L3 x 5 min with PTA present to discuss current status LAQ: 6# 3x10 Sit to stand with 10# KB 2x15 Leg press: RTLE 90# 3x10 Multi-hip 70# hip abd and ext 2 x 10 both IT band rolling for both LE's as patient is tight and compensating for left IT band tightness- he is also getting some audible popping at left lateral patella Attempted sit to stand again after rolling, Patient had one pop then no more for total of 10 reps Patellar mobilization bilaterally for lateral tracking issues due to new alignment post surgery.  Cross friction to left lateral  retinaculum.                  Reviewed S&S of infection, Game ready 10 min, mod compression.    PATIENT EDUCATION:  Education details: Initiated HEP Person educated: Patient Education method: Consulting civil engineer, Media planner, Verbal cues, and Handouts Education comprehension: verbalized understanding, returned demonstration, and verbal cues required     HOME EXERCISE PROGRAM:  Access Code: [email or web address] URL: [email or web address] Date: 02/23/2022 Prepared by: Candyce Churn  Exercises - Recumbent Bike  - 1 x daily - 7 x weekly - 3 sets - 10 reps - Supine Quadricep Sets  - 1 x daily - 7 x weekly - 3 sets - 10 reps - Small Range Straight Leg Raise  - 1 x daily - 7 x weekly - 3 sets - 10 reps - Seated Long Arc Quad  - 1 x daily - 7 x weekly - 3 sets - 10 reps - Prone Quadriceps Stretch with Strap  - 1 x daily - 7 x weekly - 3 sets - 10 reps - Squat with Chair Touch  - 1 x daily - 7 x weekly - 3 sets - 10 reps - Side Stepping with Resistance at Feet  - 1 x daily - 7 x weekly - 3 sets - 10 reps - Step Up  - 1 x daily - 7 x weekly - 3 sets - 10 reps - Backward Step Up  - 1 x daily - 7 x weekly - 3 sets - 10 reps - Single Leg Balance with Same Side Arm Star Reach  - 1 x daily - 7 x weekly - 3 sets - 10 reps - Kettlebell Deadlift  - 1 x daily - 7 x weekly - 3 sets - 10 reps - Single Leg Deadlift with Kettlebell  -  1 x daily - 7 x weekly - 3 sets - 10 reps - Single-Leg [place] Deadlift With Dumbbell  - 1 x daily - 7 x weekly - 3 sets - 10 reps - Supine ITB Stretch with Strap  - 1 x daily - 7 x weekly - 1 sets - 3 reps - 30 sec hold - Supine Figure 4 Piriformis Stretch  - 1 x daily - 7 x weekly - 1 sets - 3 reps - 30 sec hold - Supine Piriformis Stretch Pulling Heel to Hip  - 1 x daily - 7 x weekly - 1 sets - 3 reps - 30 sec hold  ASSESSMENT:   CLINICAL IMPRESSION:  Pt doing well overall. Pt has been having to take care of his sister since her return home from being in hospital. This  has made him pretty fatigued not as much time to do all of his HEP. No pain with any TE today. Mild edema in knee remains. Pt feels Game Ready very beneficial.   OBJECTIVE IMPAIRMENTS Abnormal gait, difficulty walking, decreased ROM, decreased strength, increased muscle spasms, and pain.    ACTIVITY LIMITATIONS carrying, lifting, bending, sitting, standing, squatting, sleeping, stairs, transfers, and dressing   PARTICIPATION LIMITATIONS: meal prep, cleaning, laundry, driving, shopping, community activity, occupation, and yard work   PERSONAL FACTORS Fitness, Past/current experiences, and Profession are also affecting patient's functional outcome.    REHAB POTENTIAL: Excellent   CLINICAL DECISION MAKING: Stable/uncomplicated   EVALUATION COMPLEXITY: Low     GOALS: Goals reviewed with patient? Yes   SHORT TERM GOALS: Target date: 12/22/2021  Patient will be independent with initial HEP  Baseline: Goal status: Goal met 02/07/22   2.  Pain report to be no greater than 4/10  Baseline:  Goal status: PROGRESSING   3.  Left knee flexion AROM to 100 degrees Baseline: 90 Goal status: MET 03/09/22     LONG TERM GOALS: Target date: 04/08/2022    Patient to be independent with advanced HEP  Baseline:  Goal status: NEW   2.  Patient to report pain no greater than 2/10  Baseline:  Goal status: NEW   3.  Left knee flexion and extension to be within 5 degrees of uninvolved knee Baseline:  Goal status: NEW   4.  MMT to be 5/5 on left knee flexion and extension Baseline:  Goal status: NEW   5.  Patient to be able to ascend and descend steps with reciprocal gait without compensation Baseline:  Goal status: NEW   6.  Normalized gait with good heel to toe progression Baseline:  Goal status: NEW     PLAN: PT FREQUENCY: 3x/week   PT DURATION: 6 weeks   PLANNED INTERVENTIONS: Therapeutic exercises, Therapeutic activity, Neuromuscular re-education, Balance training, Gait  training, Patient/Family education, Self Care, Joint mobilization, Stair training, DME instructions, Aquatic Therapy, Dry Needling, Electrical stimulation, Cryotherapy, Moist heat, Compression bandaging, Taping, Vasopneumatic device, Manual therapy, and Re-evaluation   PLAN FOR NEXT SESSION: Pt has 1 more scheduled visit, discuss plan with pt next.  Myrene Galas, PTA 03/14/22 12:25 PM   [facility] [address], [place], [place] [address] Phone # [phone] Fax [phone]

## 2022-03-14 ENCOUNTER — Ambulatory Visit: Payer: BC Managed Care – PPO | Admitting: Physical Therapy

## 2022-03-14 ENCOUNTER — Encounter: Payer: Self-pay | Admitting: Physical Therapy

## 2022-03-14 DIAGNOSIS — M25661 Stiffness of right knee, not elsewhere classified: Secondary | ICD-10-CM

## 2022-03-14 DIAGNOSIS — R262 Difficulty in walking, not elsewhere classified: Secondary | ICD-10-CM

## 2022-03-14 DIAGNOSIS — R600 Localized edema: Secondary | ICD-10-CM

## 2022-03-14 DIAGNOSIS — M25561 Pain in right knee: Secondary | ICD-10-CM

## 2022-03-14 DIAGNOSIS — M6281 Muscle weakness (generalized): Secondary | ICD-10-CM

## 2022-03-16 ENCOUNTER — Ambulatory Visit: Payer: BC Managed Care – PPO

## 2022-03-16 DIAGNOSIS — R262 Difficulty in walking, not elsewhere classified: Secondary | ICD-10-CM

## 2022-03-16 DIAGNOSIS — M25561 Pain in right knee: Secondary | ICD-10-CM

## 2022-03-16 DIAGNOSIS — M25661 Stiffness of right knee, not elsewhere classified: Secondary | ICD-10-CM

## 2022-03-16 DIAGNOSIS — M6281 Muscle weakness (generalized): Secondary | ICD-10-CM

## 2022-03-16 NOTE — Therapy (Signed)
OUTPATIENT PHYSICAL THERAPY TREATMENT NOTE   Patient Name: Anthone Prieur MRN: [id number] DOB:[birth year], 61 y.o., male Today's Date: 03/16/2022  PCP: Marius Ditch, MD REFERRING PROVIDER: Derry Skill, MD  END OF SESSION:   PT End of Session - 03/16/22 1158     Visit Number 15    Number of Visits 24    Date for PT Re-Evaluation 04/08/22    Authorization Type BCBS    PT Start Time 1148    PT Stop Time 1240    PT Time Calculation (min) 52 min    Activity Tolerance Patient tolerated treatment well    Behavior During Therapy WFL for tasks assessed/performed                 Past Medical History:  Diagnosis Date   ADD (attention deficit disorder)    DDD (degenerative disc disease), lumbar    Hypertension    Prostatitis    Umbilical hernia    Past Surgical History:  Procedure Laterality Date   LUMBAR DISC SURGERY     Patient Active Problem List   Diagnosis Date Noted   Ganglion cyst Left post lat knee 05/28/2021   Primary osteoarthritis of left knee 05/28/2021   Anxiety state, unspecified 01/09/2013   Attention deficit disorder without mention of hyperactivity 01/09/2013   Adjustment disorder with mixed anxiety and depressed mood 09/19/2012    REFERRING DIAG: T46.568 (ICD-10-CM) - Presence of left artificial knee joint   THERAPY DIAG:  Acute pain of right knee  Stiffness of right knee, not elsewhere classified  Difficulty in walking, not elsewhere classified  Muscle weakness (generalized)  Rationale for Evaluation and Treatment Rehabilitation  PERTINENT HISTORY: hx lumbar fusion    PRECAUTIONS: None  SUBJECTIVE:                                                                                                                                                                                      SUBJECTIVE STATEMENT: No complaints other than kinetic chain adjustment issues.  "I had to re-do half the floor in my kitchen because the dishwasher  leaked" Couldn't knee so he sat on his bottom to do this.     PAIN:  Are you having pain? Yes: NPRS scale: 2/10 Pain location: Rt knee and LT low back Pain description: Sore Aggravating factors: Overdoing it Relieving factors: ice   OBJECTIVE: (objective measures completed at initial evaluation unless otherwise dated)  DIAGNOSTIC FINDINGS: na   PATIENT SURVEYS:  FOTO: 44.03%, 74% in 17 visits.    COGNITION:           Overall cognitive status: Within functional limits for tasks assessed  SENSATION: WFL   POSTURE:  Normal.      LOWER EXTREMITY ROM:   Active ROM Right eval Left eval Right 02/07/22  Knee flexion 90  113 100  Knee extension -2 (lacking) 0    (Blank rows = not tested)   LOWER EXTREMITY MMT:            Right LE: not tested today. Left knee generally 5/5.       FUNCTIONAL TESTS:  Initial eval: 5 times sit to stand:  15.68 sec  Timed up and go (TUG): 10.85 sec   GAIT: 12/29/21 Distance walked: 300 ft Assistive device utilized: None Level of assistance: Complete Independence Comments: antalgic   TODAY'S TREATMENT:  03/16/22: Recumbent bike L3 x 10 min with PTA present to discuss current status LAQ 8#  3x10 March with 8#  x 20 Seated adductor squeeze 20x  Lunge with foot in chair (both) x 10 hold 10 sec  Resisted Cable 4 ways 30# 10x each  Game Ready 10 min post medium compression  03/14/22: Recumbent bike L3 x 10 min with PTA present to discuss current status LAQ 7.5# 3x10 Seated adductor squeeze 20x 5 sec hold Leg press: RTLE 90# 10x, 95# 10x,2 Multi-hip 70# hip abd and ext 2 x 10 both: VC to lift LE vs throw Resisted Cable 4 ways 30# 10x each  Game Ready 10 min post medium compression  03/09/22:  (Patient 12 min late) Recumbent bike L3 x 5 min with PTA present to discuss current status LAQ: 6# 3x10 Sit to stand with 10# KB 2x15 Leg press: RTLE 90# 3x10 Multi-hip 70# hip abd and ext 2 x 10 both IT band  rolling for both LE's as patient is tight and compensating for left IT band tightness- he is also getting some audible popping at left lateral patella Attempted sit to stand again after rolling, Patient had one pop then no more for total of 10 reps Patellar mobilization bilaterally for lateral tracking issues due to new alignment post surgery.  Cross friction to left lateral retinaculum.                  Reviewed S&S of infection, Game ready 10 min, mod compression.    PATIENT EDUCATION:  Education details: Initiated HEP Person educated: Patient Education method: Consulting civil engineer, Media planner, Verbal cues, and Handouts Education comprehension: verbalized understanding, returned demonstration, and verbal cues required     HOME EXERCISE PROGRAM:  Access Code: [email or web address] URL: [email or web address] Date: 02/23/2022 Prepared by: Candyce Churn  Exercises - Recumbent Bike  - 1 x daily - 7 x weekly - 3 sets - 10 reps - Supine Quadricep Sets  - 1 x daily - 7 x weekly - 3 sets - 10 reps - Small Range Straight Leg Raise  - 1 x daily - 7 x weekly - 3 sets - 10 reps - Seated Long Arc Quad  - 1 x daily - 7 x weekly - 3 sets - 10 reps - Prone Quadriceps Stretch with Strap  - 1 x daily - 7 x weekly - 3 sets - 10 reps - Squat with Chair Touch  - 1 x daily - 7 x weekly - 3 sets - 10 reps - Side Stepping with Resistance at Feet  - 1 x daily - 7 x weekly - 3 sets - 10 reps - Step Up  - 1 x daily - 7 x weekly - 3 sets - 10 reps - Backward Step Up  - 1 x  daily - 7 x weekly - 3 sets - 10 reps - Single Leg Balance with Same Side Arm Star Reach  - 1 x daily - 7 x weekly - 3 sets - 10 reps - Kettlebell Deadlift  - 1 x daily - 7 x weekly - 3 sets - 10 reps - Single Leg Deadlift with Kettlebell  - 1 x daily - 7 x weekly - 3 sets - 10 reps - Single-Leg [place] Deadlift With Dumbbell  - 1 x daily - 7 x weekly - 3 sets - 10 reps - Supine ITB Stretch with Strap  - 1 x daily - 7 x weekly - 1 sets - 3  reps - 30 sec hold - Supine Figure 4 Piriformis Stretch  - 1 x daily - 7 x weekly - 1 sets - 3 reps - 30 sec hold - Supine Piriformis Stretch Pulling Heel to Hip  - 1 x daily - 7 x weekly - 1 sets - 3 reps - 30 sec hold  ASSESSMENT:   CLINICAL IMPRESSION:  Pernell is progressing appropriately.  He has been preoccupied with caring for his family but feels he will be able to be more focused on his rehab now that his sister is home.  He continues to experience kinetic chain adjustments and some low back pain but he understands this and has been more diligent with his stretches.  He should continue to improve and would benefit from continuing skilled PT for post TKA protocol.     OBJECTIVE IMPAIRMENTS Abnormal gait, difficulty walking, decreased ROM, decreased strength, increased muscle spasms, and pain.    ACTIVITY LIMITATIONS carrying, lifting, bending, sitting, standing, squatting, sleeping, stairs, transfers, and dressing   PARTICIPATION LIMITATIONS: meal prep, cleaning, laundry, driving, shopping, community activity, occupation, and yard work   PERSONAL FACTORS Fitness, Past/current experiences, and Profession are also affecting patient's functional outcome.    REHAB POTENTIAL: Excellent   CLINICAL DECISION MAKING: Stable/uncomplicated   EVALUATION COMPLEXITY: Low     GOALS: Goals reviewed with patient? Yes   SHORT TERM GOALS: Target date: 12/22/2021  Patient will be independent with initial HEP  Baseline: Goal status: Goal met 02/07/22   2.  Pain report to be no greater than 4/10  Baseline:  Goal status: PROGRESSING   3.  Left knee flexion AROM to 100 degrees Baseline: 90 Goal status: MET 03/09/22     LONG TERM GOALS: Target date: 04/08/2022    Patient to be independent with advanced HEP  Baseline:  Goal status: PROGRESSING   2.  Patient to report pain no greater than 2/10  Baseline:  Goal status: PROGRESSING   3.  Left knee flexion and extension to be within 5  degrees of uninvolved knee Baseline:  Goal status: MET 03/16/22   4.  MMT to be 5/5 on left knee flexion and extension Baseline:  Goal status: PROGRESSING   5.  Patient to be able to ascend and descend steps with reciprocal gait without compensation Baseline:  Goal status: PROGRESSING   6.  Normalized gait with good heel to toe progression Baseline:  Goal status: PROGRESSING     PLAN: PT FREQUENCY: 3x/week   PT DURATION: 6 weeks   PLANNED INTERVENTIONS: Therapeutic exercises, Therapeutic activity, Neuromuscular re-education, Balance training, Gait training, Patient/Family education, Self Care, Joint mobilization, Stair training, DME instructions, Aquatic Therapy, Dry Needling, Electrical stimulation, Cryotherapy, Moist heat, Compression bandaging, Taping, Vasopneumatic device, Manual therapy, and Re-evaluation   PLAN FOR NEXT SESSION: Patient to schedule through  68/11/57 (end of cert) to address kinetic chain issues and final goals.  IT band stretching/rolling, quad rehab, stability training (dynamic single leg stance tasks), stair training.    Anderson Malta B. Koni Kannan, PT 03/16/22 1:22 PM   [facility] [address] [place], [place] [address] Phone # [phone] Fax [phone]

## 2022-03-18 ENCOUNTER — Ambulatory Visit: Payer: BC Managed Care – PPO | Attending: Orthopedic Surgery

## 2022-03-18 DIAGNOSIS — R252 Cramp and spasm: Secondary | ICD-10-CM | POA: Diagnosis present

## 2022-03-18 DIAGNOSIS — R600 Localized edema: Secondary | ICD-10-CM | POA: Diagnosis present

## 2022-03-18 DIAGNOSIS — M25561 Pain in right knee: Secondary | ICD-10-CM | POA: Insufficient documentation

## 2022-03-18 DIAGNOSIS — M25661 Stiffness of right knee, not elsewhere classified: Secondary | ICD-10-CM | POA: Insufficient documentation

## 2022-03-18 DIAGNOSIS — M6281 Muscle weakness (generalized): Secondary | ICD-10-CM | POA: Insufficient documentation

## 2022-03-18 DIAGNOSIS — R262 Difficulty in walking, not elsewhere classified: Secondary | ICD-10-CM | POA: Diagnosis present

## 2022-03-18 NOTE — Therapy (Signed)
OUTPATIENT PHYSICAL THERAPY TREATMENT NOTE   Patient Name: Bobby House MRN: [id number] DOB:10-29-1960, 61 y.o., male Today's Date: 03/18/2022  PCP: Marius Ditch, MD REFERRING PROVIDER: Derry Skill, MD  END OF SESSION:   PT End of Session - 03/18/22 1131     Visit Number 16    Number of Visits 24    Date for PT Re-Evaluation 04/08/22    Authorization Type BCBS    PT Start Time 1015    PT Stop Time 1115    PT Time Calculation (min) 60 min    Activity Tolerance Patient tolerated treatment well    Behavior During Therapy WFL for tasks assessed/performed                 Past Medical History:  Diagnosis Date   ADD (attention deficit disorder)    DDD (degenerative disc disease), lumbar    Hypertension    Prostatitis    Umbilical hernia    Past Surgical History:  Procedure Laterality Date   LUMBAR DISC SURGERY     Patient Active Problem List   Diagnosis Date Noted   Ganglion cyst Left post lat knee 05/28/2021   Primary osteoarthritis of left knee 05/28/2021   Anxiety state, unspecified 01/09/2013   Attention deficit disorder without mention of hyperactivity 01/09/2013   Adjustment disorder with mixed anxiety and depressed mood 09/19/2012    REFERRING DIAG: Z76.734 (ICD-10-CM) - Presence of left artificial knee joint   THERAPY DIAG:  Acute pain of right knee  Stiffness of right knee, not elsewhere classified  Difficulty in walking, not elsewhere classified  Muscle weakness (generalized)  Rationale for Evaluation and Treatment Rehabilitation  PERTINENT HISTORY: hx lumbar fusion    PRECAUTIONS: None  SUBJECTIVE:                                                                                                                                                                                      SUBJECTIVE STATEMENT: No complaints other than kinetic chain adjustment issues.  "I had to re-do half the floor in my kitchen because the dishwasher  leaked" Couldn't knee so he sat on his bottom to do this.     PAIN:  Are you having pain? Yes: NPRS scale: 2/10 Pain location: Rt knee and LT low back Pain description: Sore Aggravating factors: Overdoing it Relieving factors: ice   OBJECTIVE: (objective measures completed at initial evaluation unless otherwise dated)  DIAGNOSTIC FINDINGS: na   PATIENT SURVEYS:  FOTO: 44.03%, 74% in 17 visits.    COGNITION:           Overall cognitive status: Within functional limits for tasks assessed  SENSATION: WFL   POSTURE:  Normal.      LOWER EXTREMITY ROM:   Active ROM Right eval Left eval Right 02/07/22  Knee flexion 90  113 100  Knee extension -2 (lacking) 0    (Blank rows = not tested)   LOWER EXTREMITY MMT:            Right LE: not tested today. Left knee generally 5/5.       FUNCTIONAL TESTS:  Initial eval: 5 times sit to stand:  15.68 sec  Timed up and go (TUG): 10.85 sec   GAIT: 12/29/21 Distance walked: 300 ft Assistive device utilized: None Level of assistance: Complete Independence Comments: antalgic   TODAY'S TREATMENT:  03/18/22: NuStep L3 x 10 min with PT present to discuss current status LAQ 8#  3x10 March with 8#  x 20 Sit to stand x 10 with 15 lb, then squat to chair with balance pad x 10 Lateral band walks with red loop 3 laps of 10 steps Cone touches 3 x 10 on balance pad (right) Step up on edge of multi hip machine x 20 each LE Fwd step down on multi hip with leg bar behind to encourage proper technique (hips back) Leg Press 150# both x 20, 80 # singles 2 x 10 each Game Ready 10 min post medium compression  03/16/22: Recumbent bike L3 x 10 min with PTA present to discuss current status LAQ 8#  3x10 March with 8#  x 20 Seated adductor squeeze 20x  Lunge with foot in chair (both) x 10 hold 10 sec  Resisted Cable 4 ways 30# 10x each  Game Ready 10 min post medium compression  03/14/22: Recumbent bike L3 x 10 min  with PTA present to discuss current status LAQ 7.5# 3x10 Seated adductor squeeze 20x 5 sec hold Leg press: RTLE 90# 10x, 95# 10x,2 Multi-hip 70# hip abd and ext 2 x 10 both: VC to lift LE vs throw Resisted Cable 4 ways 30# 10x each  Game Ready 10 min post medium compression                Reviewed S&S of infection, Game ready 10 min, mod compression.    PATIENT EDUCATION:  Education details: Initiated HEP Person educated: Patient Education method: Consulting civil engineer, Media planner, Verbal cues, and Handouts Education comprehension: verbalized understanding, returned demonstration, and verbal cues required     HOME EXERCISE PROGRAM:  Access Code: [email or web address] URL: [email or web address] Date: 02/23/2022 Prepared by: Candyce Churn  Exercises - Recumbent Bike  - 1 x daily - 7 x weekly - 3 sets - 10 reps - Supine Quadricep Sets  - 1 x daily - 7 x weekly - 3 sets - 10 reps - Small Range Straight Leg Raise  - 1 x daily - 7 x weekly - 3 sets - 10 reps - Seated Long Arc Quad  - 1 x daily - 7 x weekly - 3 sets - 10 reps - Prone Quadriceps Stretch with Strap  - 1 x daily - 7 x weekly - 3 sets - 10 reps - Squat with Chair Touch  - 1 x daily - 7 x weekly - 3 sets - 10 reps - Side Stepping with Resistance at Feet  - 1 x daily - 7 x weekly - 3 sets - 10 reps - Step Up  - 1 x daily - 7 x weekly - 3 sets - 10 reps - Backward Step Up  - 1 x daily - 7 x weekly -  3 sets - 10 reps - Single Leg Balance with Same Side Arm Star Reach  - 1 x daily - 7 x weekly - 3 sets - 10 reps - Kettlebell Deadlift  - 1 x daily - 7 x weekly - 3 sets - 10 reps - Single Leg Deadlift with Kettlebell  - 1 x daily - 7 x weekly - 3 sets - 10 reps - Single-Leg [place] Deadlift With Dumbbell  - 1 x daily - 7 x weekly - 3 sets - 10 reps - Supine ITB Stretch with Strap  - 1 x daily - 7 x weekly - 1 sets - 3 reps - 30 sec hold - Supine Figure 4 Piriformis Stretch  - 1 x daily - 7 x weekly - 1 sets - 3 reps - 30 sec  hold - Supine Piriformis Stretch Pulling Heel to Hip  - 1 x daily - 7 x weekly - 1 sets - 3 reps - 30 sec hold  ASSESSMENT:   CLINICAL IMPRESSION:  Bobby House is having less back pain overall but is also having to stretch consistently.  He mentions some numbness in the left foot at times if he is walking up hills.  He was able to do all tasks today with ease with exception of step downs.  He had some discomfort at anterior knee but completed 2 sets of 10 with good technique with min verbal cues for alignment.   He should continue to improve and would benefit from continuing skilled PT for post TKA protocol.     OBJECTIVE IMPAIRMENTS Abnormal gait, difficulty walking, decreased ROM, decreased strength, increased muscle spasms, and pain.    ACTIVITY LIMITATIONS carrying, lifting, bending, sitting, standing, squatting, sleeping, stairs, transfers, and dressing   PARTICIPATION LIMITATIONS: meal prep, cleaning, laundry, driving, shopping, community activity, occupation, and yard work   PERSONAL FACTORS Fitness, Past/current experiences, and Profession are also affecting patient's functional outcome.    REHAB POTENTIAL: Excellent   CLINICAL DECISION MAKING: Stable/uncomplicated   EVALUATION COMPLEXITY: Low     GOALS: Goals reviewed with patient? Yes   SHORT TERM GOALS: Target date: 12/22/2021  Patient will be independent with initial HEP  Baseline: Goal status: Goal met 02/07/22   2.  Pain report to be no greater than 4/10  Baseline:  Goal status: PROGRESSING   3.  Left knee flexion AROM to 100 degrees Baseline: 90 Goal status: MET 03/09/22     LONG TERM GOALS: Target date: 04/08/2022    Patient to be independent with advanced HEP  Baseline:  Goal status: PROGRESSING   2.  Patient to report pain no greater than 2/10  Baseline:  Goal status: PROGRESSING   3.  Left knee flexion and extension to be within 5 degrees of uninvolved knee Baseline:  Goal status: MET 03/16/22   4.   MMT to be 5/5 on left knee flexion and extension Baseline:  Goal status: PROGRESSING   5.  Patient to be able to ascend and descend steps with reciprocal gait without compensation Baseline:  Goal status: PROGRESSING   6.  Normalized gait with good heel to toe progression Baseline:  Goal status: PROGRESSING     PLAN: PT FREQUENCY: 3x/week   PT DURATION: 6 weeks   PLANNED INTERVENTIONS: Therapeutic exercises, Therapeutic activity, Neuromuscular re-education, Balance training, Gait training, Patient/Family education, Self Care, Joint mobilization, Stair training, DME instructions, Aquatic Therapy, Dry Needling, Electrical stimulation, Cryotherapy, Moist heat, Compression bandaging, Taping, Vasopneumatic device, Manual therapy, and Re-evaluation   PLAN  FOR NEXT SESSION: Patient to schedule through 87/68/11 (end of cert) to address kinetic chain issues and final goals.  IT band stretching/rolling, quad rehab, stability training (dynamic single leg stance tasks), stair training.    Anderson Malta B. Tameria Patti, PT 03/18/22 11:32 AM   [facility] [address] [place], [place] [address] Phone # [phone] Fax [phone]

## 2022-03-30 ENCOUNTER — Ambulatory Visit: Payer: BC Managed Care – PPO

## 2022-03-31 NOTE — Therapy (Signed)
OUTPATIENT PHYSICAL THERAPY TREATMENT NOTE   Patient Name: Bobby House MRN: [id number] DOB:[birth year], 61 y.o., male Today's Date: 04/01/2022  PCP: Marius Ditch, MD REFERRING PROVIDER: Derry Skill, MD  END OF SESSION:   PT End of Session - 04/01/22 0939     Visit Number 17    Number of Visits 24    Date for PT Re-Evaluation 04/08/22    Authorization Type [employer]    PT Start Time [phone]    PT Stop Time 1030    PT Time Calculation (min) 54 min    Activity Tolerance Patient tolerated treatment well    Behavior During Therapy WFL for tasks assessed/performed                  Past Medical History:  Diagnosis Date   ADD (attention deficit disorder)    DDD (degenerative disc disease), lumbar    Hypertension    Prostatitis    Umbilical hernia    Past Surgical History:  Procedure Laterality Date   LUMBAR DISC SURGERY     Patient Active Problem List   Diagnosis Date Noted   Ganglion cyst Left post lat knee 05/28/2021   Primary osteoarthritis of left knee 05/28/2021   Anxiety state, unspecified 01/09/2013   Attention deficit disorder without mention of hyperactivity 01/09/2013   Adjustment disorder with mixed anxiety and depressed mood 09/19/2012    REFERRING DIAG: B28.413 (ICD-10-CM) - Presence of left artificial knee joint   THERAPY DIAG:  Acute pain of right knee  Stiffness of right knee, not elsewhere classified  Difficulty in walking, not elsewhere classified  Muscle weakness (generalized)  Localized edema  Rationale for Evaluation and Treatment Rehabilitation  PERTINENT HISTORY: hx lumbar fusion    PRECAUTIONS: None  SUBJECTIVE:                                                                                                                                                                                      SUBJECTIVE STATEMENT: A lot is going on in my family right now so I have not been focused on my rehab as much.  PAIN:  Are you  having pain? Yes: NPRS scale: 2/10 Pain location: Rt knee and LT low back Pain description: Sore Aggravating factors: Overdoing it Relieving factors: ice   OBJECTIVE: (objective measures completed at initial evaluation unless otherwise dated)  DIAGNOSTIC FINDINGS: na   PATIENT SURVEYS:  FOTO: 44.03%, 74% in 17 visits.    COGNITION:           Overall cognitive status: Within functional limits for tasks assessed  SENSATION: WFL   POSTURE:  Normal.      LOWER EXTREMITY ROM:   Active ROM Right eval Left eval Right 02/07/22  Knee flexion 90  113 100  Knee extension -2 (lacking) 0    (Blank rows = not tested)   LOWER EXTREMITY MMT:            Right LE: not tested today. Left knee generally 5/5.       FUNCTIONAL TESTS:  Initial eval: 5 times sit to stand:  15.68 sec  Timed up and go (TUG): 10.85 sec   GAIT: 12/29/21 Distance walked: 300 ft Assistive device utilized: None Level of assistance: Complete Independence Comments: antalgic   TODAY'S TREATMENT:   03/31/22: Recumbent L3 x 10 min with PTA  present to discuss current status LAQ 8# 3x10  Sit to stand 2x10 with 15 lb, then squat to chair with balance pad x 10 Lateral band walks with red loop 3 laps of 10 steps Cone touches 3 x 10 on balance pad (right) Leg Press 150# both x 20, 95 # singles x 10 each Game Ready 10 min post medium compression  03/18/22: NuStep L3 x 10 min with PT present to discuss current status LAQ 8#  3x10 March with 8#  x 20 Sit to stand x 10 with 15 lb, then squat to chair with balance pad x 10 Lateral band walks with red loop 3 laps of 10 steps Cone touches 3 x 10 on balance pad (right) Step up on edge of multi hip machine x 20 each LE Fwd step down on multi hip with leg bar behind to encourage proper technique (hips back) Leg Press 150# both x 20, 80 # singles 2 x 10 each Game Ready 10 min post medium compression  03/16/22: Recumbent bike L3 x 10 min  with PTA present to discuss current status LAQ 8#  3x10 March with 8#  x 20 Seated adductor squeeze 20x  Lunge with foot in chair (both) x 10 hold 10 sec  Resisted Cable 4 ways 30# 10x each  Game Ready 10 min post medium compression               Reviewed S&S of infection, Game ready 10 min, mod compression.    PATIENT EDUCATION:  Education details: Initiated HEP Person educated: Patient Education method: Consulting civil engineer, Media planner, Verbal cues, and Handouts Education comprehension: verbalized understanding, returned demonstration, and verbal cues required     HOME EXERCISE PROGRAM:  Access Code: [email or web address] URL: [email or web address] Date: 02/23/2022 Prepared by: Candyce Churn  Exercises - Recumbent Bike  - 1 x daily - 7 x weekly - 3 sets - 10 reps - Supine Quadricep Sets  - 1 x daily - 7 x weekly - 3 sets - 10 reps - Small Range Straight Leg Raise  - 1 x daily - 7 x weekly - 3 sets - 10 reps - Seated Long Arc Quad  - 1 x daily - 7 x weekly - 3 sets - 10 reps - Prone Quadriceps Stretch with Strap  - 1 x daily - 7 x weekly - 3 sets - 10 reps - Squat with Chair Touch  - 1 x daily - 7 x weekly - 3 sets - 10 reps - Side Stepping with Resistance at Feet  - 1 x daily - 7 x weekly - 3 sets - 10 reps - Step Up  - 1 x daily - 7 x weekly - 3 sets - 10 reps - Backward  Step Up  - 1 x daily - 7 x weekly - 3 sets - 10 reps - Single Leg Balance with Same Side Arm Star Reach  - 1 x daily - 7 x weekly - 3 sets - 10 reps - Kettlebell Deadlift  - 1 x daily - 7 x weekly - 3 sets - 10 reps - Single Leg Deadlift with Kettlebell  - 1 x daily - 7 x weekly - 3 sets - 10 reps - Single-Leg [place] Deadlift With Dumbbell  - 1 x daily - 7 x weekly - 3 sets - 10 reps - Supine ITB Stretch with Strap  - 1 x daily - 7 x weekly - 1 sets - 3 reps - 30 sec hold - Supine Figure 4 Piriformis Stretch  - 1 x daily - 7 x weekly - 1 sets - 3 reps - 30 sec hold - Supine Piriformis Stretch Pulling Heel to  Hip  - 1 x daily - 7 x weekly - 1 sets - 3 reps - 30 sec hold  ASSESSMENT:   CLINICAL IMPRESSION:  Pt arrives with essentially no knee pain but it is exhausted from having a lot of stress taking care of his sister.  Single leg balance is challenging with cone touch exercise.   OBJECTIVE IMPAIRMENTS Abnormal gait, difficulty walking, decreased ROM, decreased strength, increased muscle spasms, and pain.    ACTIVITY LIMITATIONS carrying, lifting, bending, sitting, standing, squatting, sleeping, stairs, transfers, and dressing   PARTICIPATION LIMITATIONS: meal prep, cleaning, laundry, driving, shopping, community activity, occupation, and yard work   PERSONAL FACTORS Fitness, Past/current experiences, and Profession are also affecting patient's functional outcome.    REHAB POTENTIAL: Excellent   CLINICAL DECISION MAKING: Stable/uncomplicated   EVALUATION COMPLEXITY: Low     GOALS: Goals reviewed with patient? Yes   SHORT TERM GOALS: Target date: 12/22/2021  Patient will be independent with initial HEP  Baseline: Goal status: Goal met 02/07/22   2.  Pain report to be no greater than 4/10  Baseline:  Goal status: PROGRESSING   3.  Left knee flexion AROM to 100 degrees Baseline: 90 Goal status: MET 03/09/22     LONG TERM GOALS: Target date: 04/08/2022    Patient to be independent with advanced HEP  Baseline:  Goal status: PROGRESSING   2.  Patient to report pain no greater than 2/10  Baseline:  Goal status: PROGRESSING   3.  Left knee flexion and extension to be within 5 degrees of uninvolved knee Baseline:  Goal status: MET 03/16/22   4.  MMT to be 5/5 on left knee flexion and extension Baseline:  Goal status: PROGRESSING   5.  Patient to be able to ascend and descend steps with reciprocal gait without compensation Baseline:  Goal status: PROGRESSING   6.  Normalized gait with good heel to toe progression Baseline:  Goal status: PROGRESSING     PLAN: PT  FREQUENCY: 3x/week   PT DURATION: 6 weeks   PLANNED INTERVENTIONS: Therapeutic exercises, Therapeutic activity, Neuromuscular re-education, Balance training, Gait training, Patient/Family education, Self Care, Joint mobilization, Stair training, DME instructions, Aquatic Therapy, Dry Needling, Electrical stimulation, Cryotherapy, Moist heat, Compression bandaging, Taping, Vasopneumatic device, Manual therapy, and Re-evaluation   PLAN FOR NEXT SESSION: Patient to schedule through 27/03/50 (end of cert) to address kinetic chain issues and final goals.  IT band stretching/rolling, quad rehab, stability training (dynamic single leg stance tasks), stair training.    Myrene Galas, PTA 04/01/22 10:07 AM   [facility] [address] [place], [place] [address] Phone # [phone] Fax [phone]

## 2022-04-01 ENCOUNTER — Ambulatory Visit: Payer: BC Managed Care – PPO | Admitting: Physical Therapy

## 2022-04-01 ENCOUNTER — Encounter: Payer: Self-pay | Admitting: Physical Therapy

## 2022-04-01 DIAGNOSIS — M6281 Muscle weakness (generalized): Secondary | ICD-10-CM

## 2022-04-01 DIAGNOSIS — M25661 Stiffness of right knee, not elsewhere classified: Secondary | ICD-10-CM

## 2022-04-01 DIAGNOSIS — R262 Difficulty in walking, not elsewhere classified: Secondary | ICD-10-CM

## 2022-04-01 DIAGNOSIS — M25561 Pain in right knee: Secondary | ICD-10-CM | POA: Diagnosis not present

## 2022-04-01 DIAGNOSIS — R600 Localized edema: Secondary | ICD-10-CM

## 2022-04-04 ENCOUNTER — Ambulatory Visit: Payer: BC Managed Care – PPO | Admitting: Physical Therapy

## 2022-04-04 ENCOUNTER — Encounter: Payer: Self-pay | Admitting: Physical Therapy

## 2022-04-04 DIAGNOSIS — R600 Localized edema: Secondary | ICD-10-CM

## 2022-04-04 DIAGNOSIS — M25661 Stiffness of right knee, not elsewhere classified: Secondary | ICD-10-CM

## 2022-04-04 DIAGNOSIS — M25561 Pain in right knee: Secondary | ICD-10-CM | POA: Diagnosis not present

## 2022-04-04 DIAGNOSIS — M6281 Muscle weakness (generalized): Secondary | ICD-10-CM

## 2022-04-04 DIAGNOSIS — R262 Difficulty in walking, not elsewhere classified: Secondary | ICD-10-CM

## 2022-04-04 NOTE — Therapy (Signed)
OUTPATIENT PHYSICAL THERAPY TREATMENT NOTE   Patient Name: Bobby House MRN: [id number] DOB:10/25/1960, 61 y.o., male Today's Date: 04/04/2022  PCP: Marius Ditch, MD REFERRING PROVIDER: Derry Skill, MD  END OF SESSION:   PT End of Session - 04/04/22 1513     Visit Number 18    Number of Visits 24    Authorization Type [employer]    PT Start Time 1402    PT Stop Time 1450    PT Time Calculation (min) 48 min    Activity Tolerance Patient tolerated treatment well    Behavior During Therapy WFL for tasks assessed/performed              Past Medical History:  Diagnosis Date   ADD (attention deficit disorder)    DDD (degenerative disc disease), lumbar    Hypertension    Prostatitis    Umbilical hernia    Past Surgical History:  Procedure Laterality Date   LUMBAR DISC SURGERY     Patient Active Problem List   Diagnosis Date Noted   Ganglion cyst Left post lat knee 05/28/2021   Primary osteoarthritis of left knee 05/28/2021   Anxiety state, unspecified 01/09/2013   Attention deficit disorder without mention of hyperactivity 01/09/2013   Adjustment disorder with mixed anxiety and depressed mood 09/19/2012    REFERRING DIAG: K80.034 (ICD-10-CM) - Presence of left artificial knee joint   THERAPY DIAG:  Acute pain of right knee  Stiffness of right knee, not elsewhere classified  Difficulty in walking, not elsewhere classified  Muscle weakness (generalized)  Localized edema  Rationale for Evaluation and Treatment Rehabilitation  PERTINENT HISTORY: hx lumbar fusion    PRECAUTIONS: None  SUBJECTIVE:                                                                                                                                                                                      SUBJECTIVE STATEMENT: A lot is going on in my family right now so I have not been focused on my rehab as much.  PAIN:  Are you having pain? Yes: NPRS scale:  2/10 Pain location: Rt knee and LT low back Pain description: Sore Aggravating factors: Overdoing it Relieving factors: ice   OBJECTIVE: (objective measures completed at initial evaluation unless otherwise dated)  DIAGNOSTIC FINDINGS: na   PATIENT SURVEYS:  FOTO: 44.03%, 74% in 17 visits.    COGNITION:           Overall cognitive status: Within functional limits for tasks assessed  SENSATION: WFL   POSTURE:  Normal.      LOWER EXTREMITY ROM:   Active ROM Right eval Left eval Right 02/07/22  Knee flexion 90  113 100  Knee extension -2 (lacking) 0    (Blank rows = not tested)   LOWER EXTREMITY MMT:            Right LE: not tested today. Left knee generally 5/5.       FUNCTIONAL TESTS:  Initial eval: 5 times sit to stand:  15.68 sec  Timed up and go (TUG): 10.85 sec   GAIT: 12/29/21 Distance walked: 300 ft Assistive device utilized: None Level of assistance: Complete Independence Comments: antalgic   TODAY'S TREATMENT:   03/31/22: Recumbent L4 x 10 min with PTA  present to discuss current status LAQ 8# 3x10  Sit to stand 2x10 with 25 lb Sl stance on airex pad, throwing red ball at re-bounder.  Lateral band walks with red loop 3 laps of 10 steps Leg Press 150# both x 20, 95 # singles 2x 10 each Game Ready 10 min post medium compression  03/18/22: NuStep L3 x 10 min with PT present to discuss current status LAQ 8#  3x10 March with 8#  x 20 Sit to stand x 10 with 15 lb, then squat to chair with balance pad x 10 Lateral band walks with red loop 3 laps of 10 steps Cone touches 3 x 10 on balance pad (right) Step up on edge of multi hip machine x 20 each LE Fwd step down on multi hip with leg bar behind to encourage proper technique (hips back) Leg Press 150# both x 20, 80 # singles 2 x 10 each Game Ready 10 min post medium compression  03/16/22: Recumbent bike L3 x 10 min with PTA present to discuss current status LAQ 8#   3x10 March with 8#  x 20 Seated adductor squeeze 20x  Lunge with foot in chair (both) x 10 hold 10 sec  Resisted Cable 4 ways 30# 10x each  Game Ready 10 min post medium compression               Reviewed S&S of infection, Game ready 10 min, mod compression.    PATIENT EDUCATION:  Education details: Initiated HEP Person educated: Patient Education method: Consulting civil engineer, Media planner, Verbal cues, and Handouts Education comprehension: verbalized understanding, returned demonstration, and verbal cues required     HOME EXERCISE PROGRAM:  Access Code: [email or web address] URL: [email or web address] Date: 02/23/2022 Prepared by: Candyce Churn  Exercises - Recumbent Bike  - 1 x daily - 7 x weekly - 3 sets - 10 reps - Supine Quadricep Sets  - 1 x daily - 7 x weekly - 3 sets - 10 reps - Small Range Straight Leg Raise  - 1 x daily - 7 x weekly - 3 sets - 10 reps - Seated Long Arc Quad  - 1 x daily - 7 x weekly - 3 sets - 10 reps - Prone Quadriceps Stretch with Strap  - 1 x daily - 7 x weekly - 3 sets - 10 reps - Squat with Chair Touch  - 1 x daily - 7 x weekly - 3 sets - 10 reps - Side Stepping with Resistance at Feet  - 1 x daily - 7 x weekly - 3 sets - 10 reps - Step Up  - 1 x daily - 7 x weekly - 3 sets - 10 reps - Backward Step Up  - 1 x daily -  7 x weekly - 3 sets - 10 reps - Single Leg Balance with Same Side Arm Star Reach  - 1 x daily - 7 x weekly - 3 sets - 10 reps - Kettlebell Deadlift  - 1 x daily - 7 x weekly - 3 sets - 10 reps - Single Leg Deadlift with Kettlebell  - 1 x daily - 7 x weekly - 3 sets - 10 reps - Single-Leg [place] Deadlift With Dumbbell  - 1 x daily - 7 x weekly - 3 sets - 10 reps - Supine ITB Stretch with Strap  - 1 x daily - 7 x weekly - 1 sets - 3 reps - 30 sec hold - Supine Figure 4 Piriformis Stretch  - 1 x daily - 7 x weekly - 1 sets - 3 reps - 30 sec hold - Supine Piriformis Stretch Pulling Heel to Hip  - 1 x daily - 7 x weekly - 1 sets - 3 reps -  30 sec hold  ASSESSMENT:   CLINICAL IMPRESSION:  Pt arrives with essentially no knee pain but it is exhausted from having a lot of stress taking care of his sister.  Pt demonstrates improvements in functional strength and ROM. Challenged him with SLS today with improvements noted. Plan to D/C pt to comprhensive HEP next session.   OBJECTIVE IMPAIRMENTS Abnormal gait, difficulty walking, decreased ROM, decreased strength, increased muscle spasms, and pain.    ACTIVITY LIMITATIONS carrying, lifting, bending, sitting, standing, squatting, sleeping, stairs, transfers, and dressing   PARTICIPATION LIMITATIONS: meal prep, cleaning, laundry, driving, shopping, community activity, occupation, and yard work   PERSONAL FACTORS Fitness, Past/current experiences, and Profession are also affecting patient's functional outcome.    REHAB POTENTIAL: Excellent   CLINICAL DECISION MAKING: Stable/uncomplicated   EVALUATION COMPLEXITY: Low     GOALS: Goals reviewed with patient? Yes   SHORT TERM GOALS: Target date: 12/22/2021  Patient will be independent with initial HEP  Baseline: Goal status: Goal met 02/07/22   2.  Pain report to be no greater than 4/10  Baseline:  Goal status: PROGRESSING   3.  Left knee flexion AROM to 100 degrees Baseline: 90 Goal status: MET 03/09/22     LONG TERM GOALS: Target date: 04/08/2022    Patient to be independent with advanced HEP  Baseline:  Goal status: PROGRESSING   2.  Patient to report pain no greater than 2/10  Baseline:  Goal status: PROGRESSING   3.  Left knee flexion and extension to be within 5 degrees of uninvolved knee Baseline:  Goal status: MET 03/16/22   4.  MMT to be 5/5 on left knee flexion and extension Baseline:  Goal status: PROGRESSING   5.  Patient to be able to ascend and descend steps with reciprocal gait without compensation Baseline:  Goal status: PROGRESSING   6.  Normalized gait with good heel to toe  progression Baseline:  Goal status: PROGRESSING     PLAN: PT FREQUENCY: 3x/week   PT DURATION: 6 weeks   PLANNED INTERVENTIONS: Therapeutic exercises, Therapeutic activity, Neuromuscular re-education, Balance training, Gait training, Patient/Family education, Self Care, Joint mobilization, Stair training, DME instructions, Aquatic Therapy, Dry Needling, Electrical stimulation, Cryotherapy, Moist heat, Compression bandaging, Taping, Vasopneumatic device, Manual therapy, and Re-evaluation   PLAN FOR NEXT SESSION: Plan to D/C pt next session.   Rudi Heap PT, DPT 04/04/22  3:15 PM

## 2022-04-06 ENCOUNTER — Ambulatory Visit: Payer: BC Managed Care – PPO

## 2022-04-06 DIAGNOSIS — M25561 Pain in right knee: Secondary | ICD-10-CM

## 2022-04-06 DIAGNOSIS — R262 Difficulty in walking, not elsewhere classified: Secondary | ICD-10-CM

## 2022-04-06 DIAGNOSIS — M25661 Stiffness of right knee, not elsewhere classified: Secondary | ICD-10-CM

## 2022-04-06 DIAGNOSIS — R252 Cramp and spasm: Secondary | ICD-10-CM

## 2022-04-06 DIAGNOSIS — M6281 Muscle weakness (generalized): Secondary | ICD-10-CM

## 2022-04-06 NOTE — Therapy (Signed)
OUTPATIENT PHYSICAL THERAPY DISCHARGE NOTE   Patient Name: Bobby House MRN: [id number] DOB:[birth year], 61 y.o., male Today's Date: 04/06/2022  PCP: Marius Ditch, MD REFERRING PROVIDER: Derry Skill, MD PHYSICAL THERAPY DISCHARGE SUMMARY  Visits from Start of Care: 19  Current functional level related to goals / functional outcomes: See below   Remaining deficits: See below   Education / Equipment: See below   Patient agrees to discharge. Patient goals were met. Patient is being discharged due to meeting the stated rehab goals.  END OF SESSION:   PT End of Session - 04/06/22 1108     Visit Number 19    Number of Visits 24    Date for PT Re-Evaluation 04/08/22    Authorization Type [employer]    PT Start Time 1100    PT Stop Time 1135    PT Time Calculation (min) 35 min    Activity Tolerance Patient tolerated treatment well    Behavior During Therapy WFL for tasks assessed/performed              Past Medical History:  Diagnosis Date   ADD (attention deficit disorder)    DDD (degenerative disc disease), lumbar    Hypertension    Prostatitis    Umbilical hernia    Past Surgical History:  Procedure Laterality Date   LUMBAR DISC SURGERY     Patient Active Problem List   Diagnosis Date Noted   Ganglion cyst Left post lat knee 05/28/2021   Primary osteoarthritis of left knee 05/28/2021   Anxiety state, unspecified 01/09/2013   Attention deficit disorder without mention of hyperactivity 01/09/2013   Adjustment disorder with mixed anxiety and depressed mood 09/19/2012    REFERRING DIAG: W09.811 (ICD-10-CM) - Presence of left artificial knee joint   THERAPY DIAG:  Acute pain of right knee  Stiffness of right knee, not elsewhere classified  Difficulty in walking, not elsewhere classified  Muscle weakness (generalized)  Cramp and spasm  Rationale for Evaluation and Treatment Rehabilitation  PERTINENT HISTORY: hx lumbar  fusion    PRECAUTIONS: None  SUBJECTIVE:                                                                                                                                                                                      SUBJECTIVE STATEMENT: A lot is going on in my family right now so I have not been focused on my rehab as much. Doing most all routine functional activities with ease.    PAIN:  Are you having pain? Yes: NPRS scale: 2/10 Pain location: Rt knee and LT low back Pain description: Sore Aggravating factors: Overdoing  it Relieving factors: ice   OBJECTIVE: (objective measures completed at initial evaluation unless otherwise dated)  DIAGNOSTIC FINDINGS: na   PATIENT SURVEYS:  FOTO: 44.03%, 74% in 17 visits.  FOTO: 74% (04/06/22)   COGNITION:           Overall cognitive status: Within functional limits for tasks assessed                          SENSATION: WFL   POSTURE:  Normal.      LOWER EXTREMITY ROM:   Active ROM Right eval Left eval Right 02/07/22 Right  04/06/22  Knee flexion 90  113 100 116  Knee extension -2 (lacking) 0  0   (Blank rows = not tested)   LOWER EXTREMITY MMT:            Right LE: not tested today. Left knee generally 5/5.       FUNCTIONAL TESTS:  Initial eval: 5 times sit to stand:  15.68 sec  Timed up and go (TUG): 10.85 sec  04/06/22: 5 times sit to stand:  8.41 sec  Timed up and go (TUG): 6.62 sec   GAIT: 12/29/21 Distance walked: 300 ft Assistive device utilized: None Level of assistance: Complete Independence Comments: antalgic   TODAY'S TREATMENT:  04/06/22: DC evaluation and DC plan discussed  03/31/22: Recumbent L4 x 10 min with PTA  present to discuss current status LAQ 8# 3x10  Sit to stand 2x10 with 25 lb Sl stance on airex pad, throwing red ball at re-bounder.  Lateral band walks with red loop 3 laps of 10 steps Leg Press 150# both x 20, 95 # singles 2x 10 each Game Ready 10 min post medium  compression  03/18/22: NuStep L3 x 10 min with PT present to discuss current status LAQ 8#  3x10 March with 8#  x 20 Sit to stand x 10 with 15 lb, then squat to chair with balance pad x 10 Lateral band walks with red loop 3 laps of 10 steps Cone touches 3 x 10 on balance pad (right) Step up on edge of multi hip machine x 20 each LE Fwd step down on multi hip with leg bar behind to encourage proper technique (hips back) Leg Press 150# both x 20, 80 # singles 2 x 10 each Game Ready 10 min post medium compression              Reviewed S&S of infection, Game ready 10 min, mod compression.    PATIENT EDUCATION:  Education details: Initiated HEP Person educated: Patient Education method: Consulting civil engineer, Media planner, Verbal cues, and Handouts Education comprehension: verbalized understanding, returned demonstration, and verbal cues required     HOME EXERCISE PROGRAM:  Access Code: [email or web address] URL: [email or web address] Date: 02/23/2022 Prepared by: Candyce Churn  Exercises - Recumbent Bike  - 1 x daily - 7 x weekly - 3 sets - 10 reps - Supine Quadricep Sets  - 1 x daily - 7 x weekly - 3 sets - 10 reps - Small Range Straight Leg Raise  - 1 x daily - 7 x weekly - 3 sets - 10 reps - Seated Long Arc Quad  - 1 x daily - 7 x weekly - 3 sets - 10 reps - Prone Quadriceps Stretch with Strap  - 1 x daily - 7 x weekly - 3 sets - 10 reps - Squat with Chair Touch  - 1 x daily - 7 x weekly -  3 sets - 10 reps - Side Stepping with Resistance at Feet  - 1 x daily - 7 x weekly - 3 sets - 10 reps - Step Up  - 1 x daily - 7 x weekly - 3 sets - 10 reps - Backward Step Up  - 1 x daily - 7 x weekly - 3 sets - 10 reps - Single Leg Balance with Same Side Arm Star Reach  - 1 x daily - 7 x weekly - 3 sets - 10 reps - Kettlebell Deadlift  - 1 x daily - 7 x weekly - 3 sets - 10 reps - Single Leg Deadlift with Kettlebell  - 1 x daily - 7 x weekly - 3 sets - 10 reps - Single-Leg [place] Deadlift  With Dumbbell  - 1 x daily - 7 x weekly - 3 sets - 10 reps - Supine ITB Stretch with Strap  - 1 x daily - 7 x weekly - 1 sets - 3 reps - 30 sec hold - Supine Figure 4 Piriformis Stretch  - 1 x daily - 7 x weekly - 1 sets - 3 reps - 30 sec hold - Supine Piriformis Stretch Pulling Heel to Hip  - 1 x daily - 7 x weekly - 1 sets - 3 reps - 30 sec hold  ASSESSMENT:   CLINICAL IMPRESSION:  Bobby House has met all goals.  He is functioning at approx 85% of his usual functional level.  He is able to do modified bend, stoop and squat and is able to do reciprocal gait with ascending and descending steps.  He walks with good heel to toe progression.  He does continue to have some audible popping in the left knee at times, most likely due to tightness in IT band. He understands with correction to alignment, he will need to continue to focus on LE flexibility, with heavy emphasis on IT band and TFL.  He is compliant and well motivated.  He should continue to improve.    OBJECTIVE IMPAIRMENTS Abnormal gait, difficulty walking, decreased ROM, decreased strength, increased muscle spasms, and pain.    ACTIVITY LIMITATIONS carrying, lifting, bending, sitting, standing, squatting, sleeping, stairs, transfers, and dressing   PARTICIPATION LIMITATIONS: meal prep, cleaning, laundry, driving, shopping, community activity, occupation, and yard work   PERSONAL FACTORS Fitness, Past/current experiences, and Profession are also affecting patient's functional outcome.    REHAB POTENTIAL: Excellent   CLINICAL DECISION MAKING: Stable/uncomplicated   EVALUATION COMPLEXITY: Low     GOALS: Goals reviewed with patient? Yes   SHORT TERM GOALS: Target date: 12/22/2021  Patient will be independent with initial HEP  Baseline: Goal status: Goal met 02/07/22   2.  Pain report to be no greater than 4/10  Baseline:  Goal status: MET   3.  Left knee flexion AROM to 100 degrees Baseline: 90 Goal status: MET 03/09/22     LONG  TERM GOALS: Target date: 04/08/2022    Patient to be independent with advanced HEP  Baseline:  Goal status: MET   2.  Patient to report pain no greater than 2/10  Baseline:  Goal status: MET   3.  Left knee flexion and extension to be within 5 degrees of uninvolved knee Baseline:  Goal status: MET 03/16/22   4.  MMT to be 5/5 on left knee flexion and extension Baseline:  Goal status: MET   5.  Patient to be able to ascend and descend steps with reciprocal gait without compensation Baseline:  Goal status: MET   6.  Normalized gait with good heel to toe progression Baseline:  Goal status: MET     PLAN: PT FREQUENCY: 3x/week   PT DURATION: 6 weeks   PLANNED INTERVENTIONS: Therapeutic exercises, Therapeutic activity, Neuromuscular re-education, Balance training, Gait training, Patient/Family education, Self Care, Joint mobilization, Stair training, DME instructions, Aquatic Therapy, Dry Needling, Electrical stimulation, Cryotherapy, Moist heat, Compression bandaging, Taping, Vasopneumatic device, Manual therapy, and Re-evaluation   PLAN FOR NEXT SESSION: DC to HEP at this time with all goals met.   Anderson Malta B. Arkin Imran, PT 04/06/22 11:50 AM  [facility] [address], [place], [place] [address] Phone # [phone] Fax [phone]

## 2023-06-30 IMAGING — MR MR KNEE*L* W/O CM
7 series · 40 of 40 positions shown · non-contrast
Comparison: None.

CLINICAL DATA: Posterior left knee pain for 1 month.

EXAM:
MRI OF THE LEFT KNEE WITHOUT CONTRAST
TECHNIQUE: Multiplanar, multisequence MR imaging of the knee was performed. No
intravenous contrast was administered.

[Series 3: T2 fat-sat · axial · 4.0mm · 0.62mm/px · z∈[-77,+103]mm · 6 of 37 slices shown (1 of 3)]
[im 1/37]
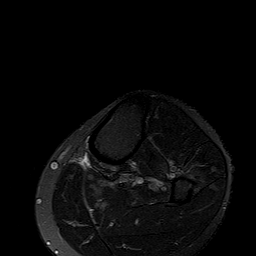
[im 8/37]
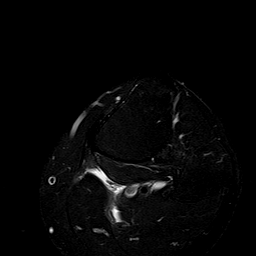
[im 15/37]
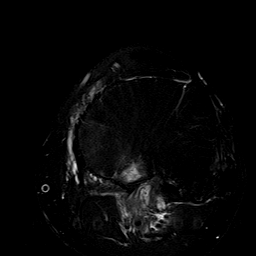
[im 22/37]
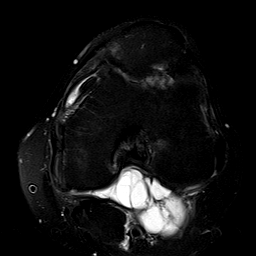
[im 29/37]
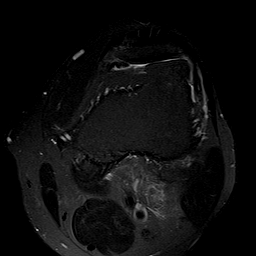
[im 37/37]
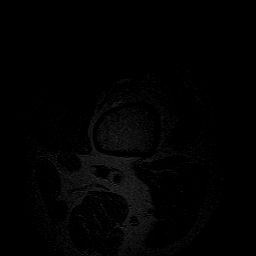

[Series 4: T1 · coronal · 4.0mm · 0.59mm/px · 6 of 32 slices shown]
[im 1/32]
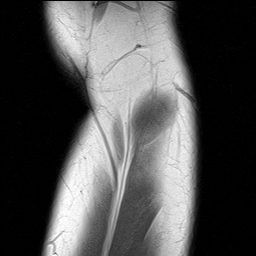
[im 7/32]
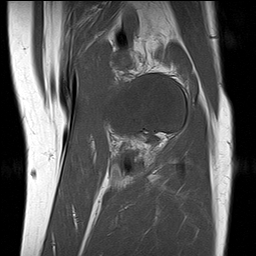
[im 13/32]
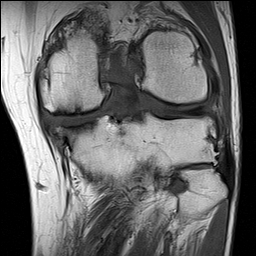
[im 19/32]
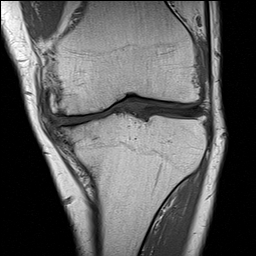
[im 25/32]
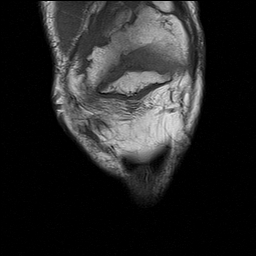
[im 32/32]
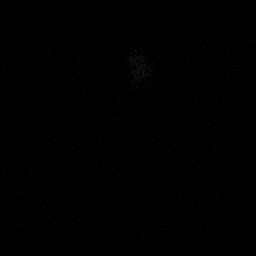

[Series 5: T2 fat-sat · coronal · 4.0mm · 0.59mm/px · 6 of 32 slices shown (2 of 3)]
[im 1/32]
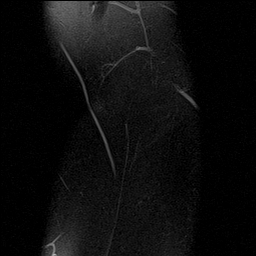
[im 7/32]
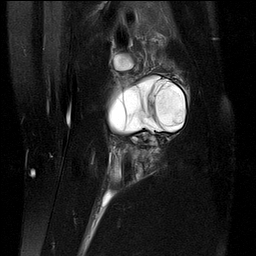
[im 13/32]
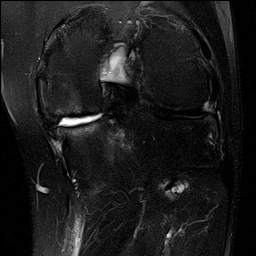
[im 19/32]
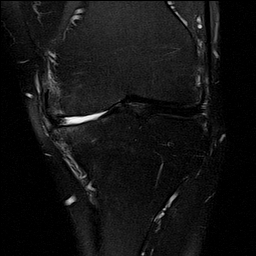
[im 25/32]
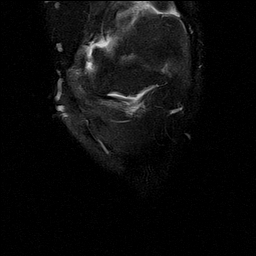
[im 32/32]
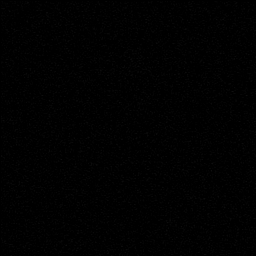

[Series 6: PD fat-sat · coronal · 4.0mm · 0.59mm/px · 6 of 32 slices shown (1 of 3)]
[im 1/32]
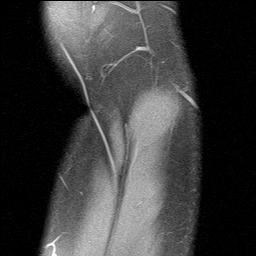
[im 7/32]
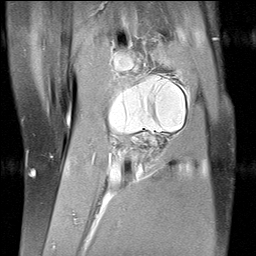
[im 13/32]
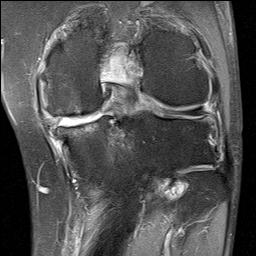
[im 19/32]
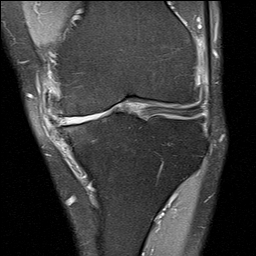
[im 25/32]
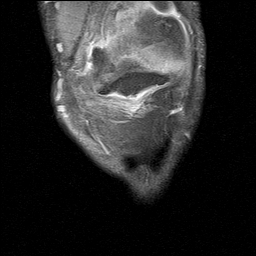
[im 32/32]
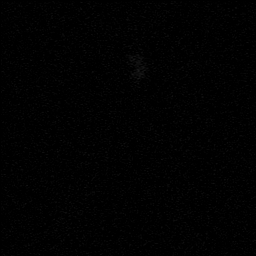

[Series 7: PD fat-sat · sagittal · 3.0mm · 0.59mm/px · 6 of 35 slices shown (2 of 3)]
[im 1/35]
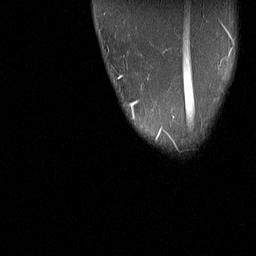
[im 7/35]
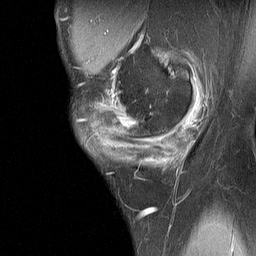
[im 14/35]
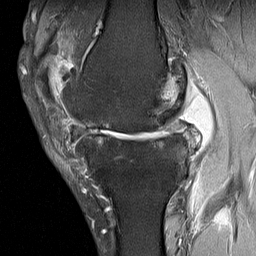
[im 21/35]
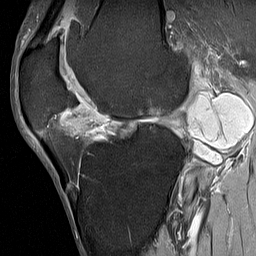
[im 28/35]
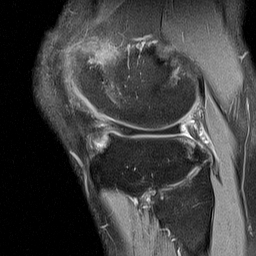
[im 35/35]
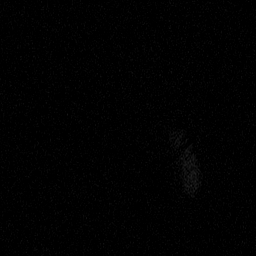

[Series 8: T2 fat-sat · sagittal · 3.0mm · 0.59mm/px · 6 of 35 slices shown (3 of 3)]
[im 1/35]
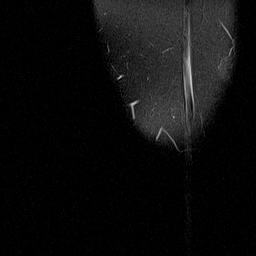
[im 7/35]
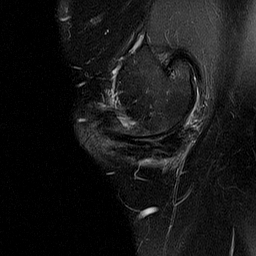
[im 14/35]
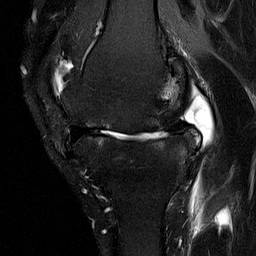
[im 21/35]
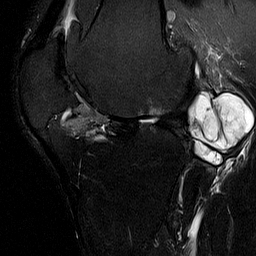
[im 28/35]
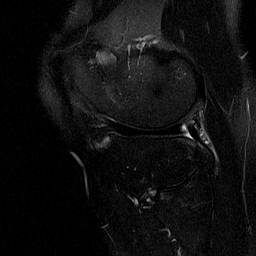
[im 35/35]
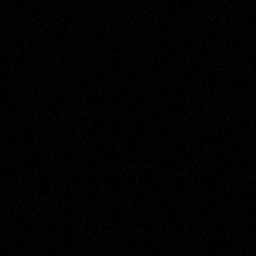

[Series 9: PD fat-sat · coronal · 2.0mm · 0.59mm/px · 4 of 25 slices shown (3 of 3)]
[im 1/25]
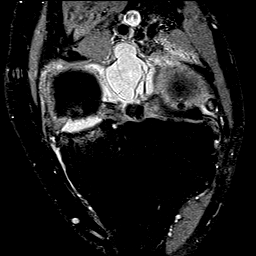
[im 9/25]
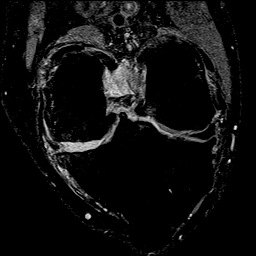
[im 17/25]
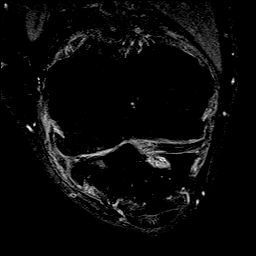
[im 25/25]
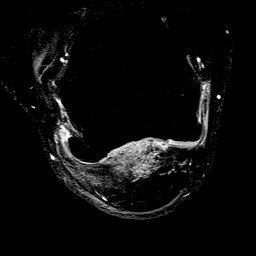

[40 of 40 positions shown; findings below may reference images not displayed]

FINDINGS: MENISCI

Medial: Maceration of the medial meniscus.

Lateral: Radial tear of the posterior horn of the lateral meniscus
at the meniscal root.

LIGAMENTS

Cruciates: Complete chronic ACL tear. Degeneration of the PCL which
is otherwise intact.

Collaterals: Medial collateral ligament is intact. Lateral
collateral ligament complex is intact.

CARTILAGE

Patellofemoral: Partial-thickness cartilage loss of the
patellofemoral compartment with marginal osteophytes.

Medial: Extensive full-thickness cartilage loss of the medial
femorotibial compartment subchondral reactive marrow edema and small
marginal osteophytes.

Lateral: With partial-thickness cartilage loss of the lateral
femorotibial compartment with small marginal osteophytes.

JOINT: Small joint effusion. Normal [place]pigbb Moolman. No plical
thickening. 5.6 x 3.8 x 4.6 cm outpouching of the posterior joint
capsule with synovitis. Mild osteoarthritis of the proximal
tibia-fibular joint.

POPLITEAL FOSSA: Popliteus tendon is intact. Tiny Baker's cyst.

EXTENSOR MECHANISM: Intact quadriceps tendon. Intact patellar
tendon. Intact lateral patellar retinaculum. Intact medial patellar
retinaculum. Intact MPFL.

BONES: No aggressive osseous lesion. No fracture or dislocation.

Other: No fluid collection or hematoma. Muscles are normal.
IMPRESSION: 1. Maceration of the medial meniscus.
2. Radial tear of the posterior horn of the lateral meniscus at the
meniscal root.
3. Tricompartmental cartilage abnormalities as described above most
severe in the medial femorotibial compartment.
4. Complete chronic ACL tear.

## 2023-07-24 IMAGING — DX DG CHEST 2V
2 series · 2 of 2 positions shown · non-contrast
Comparison: None Available.

CLINICAL DATA: Pre-op clearance exam

EXAM:
CHEST - 2 VIEW

[chest pa]
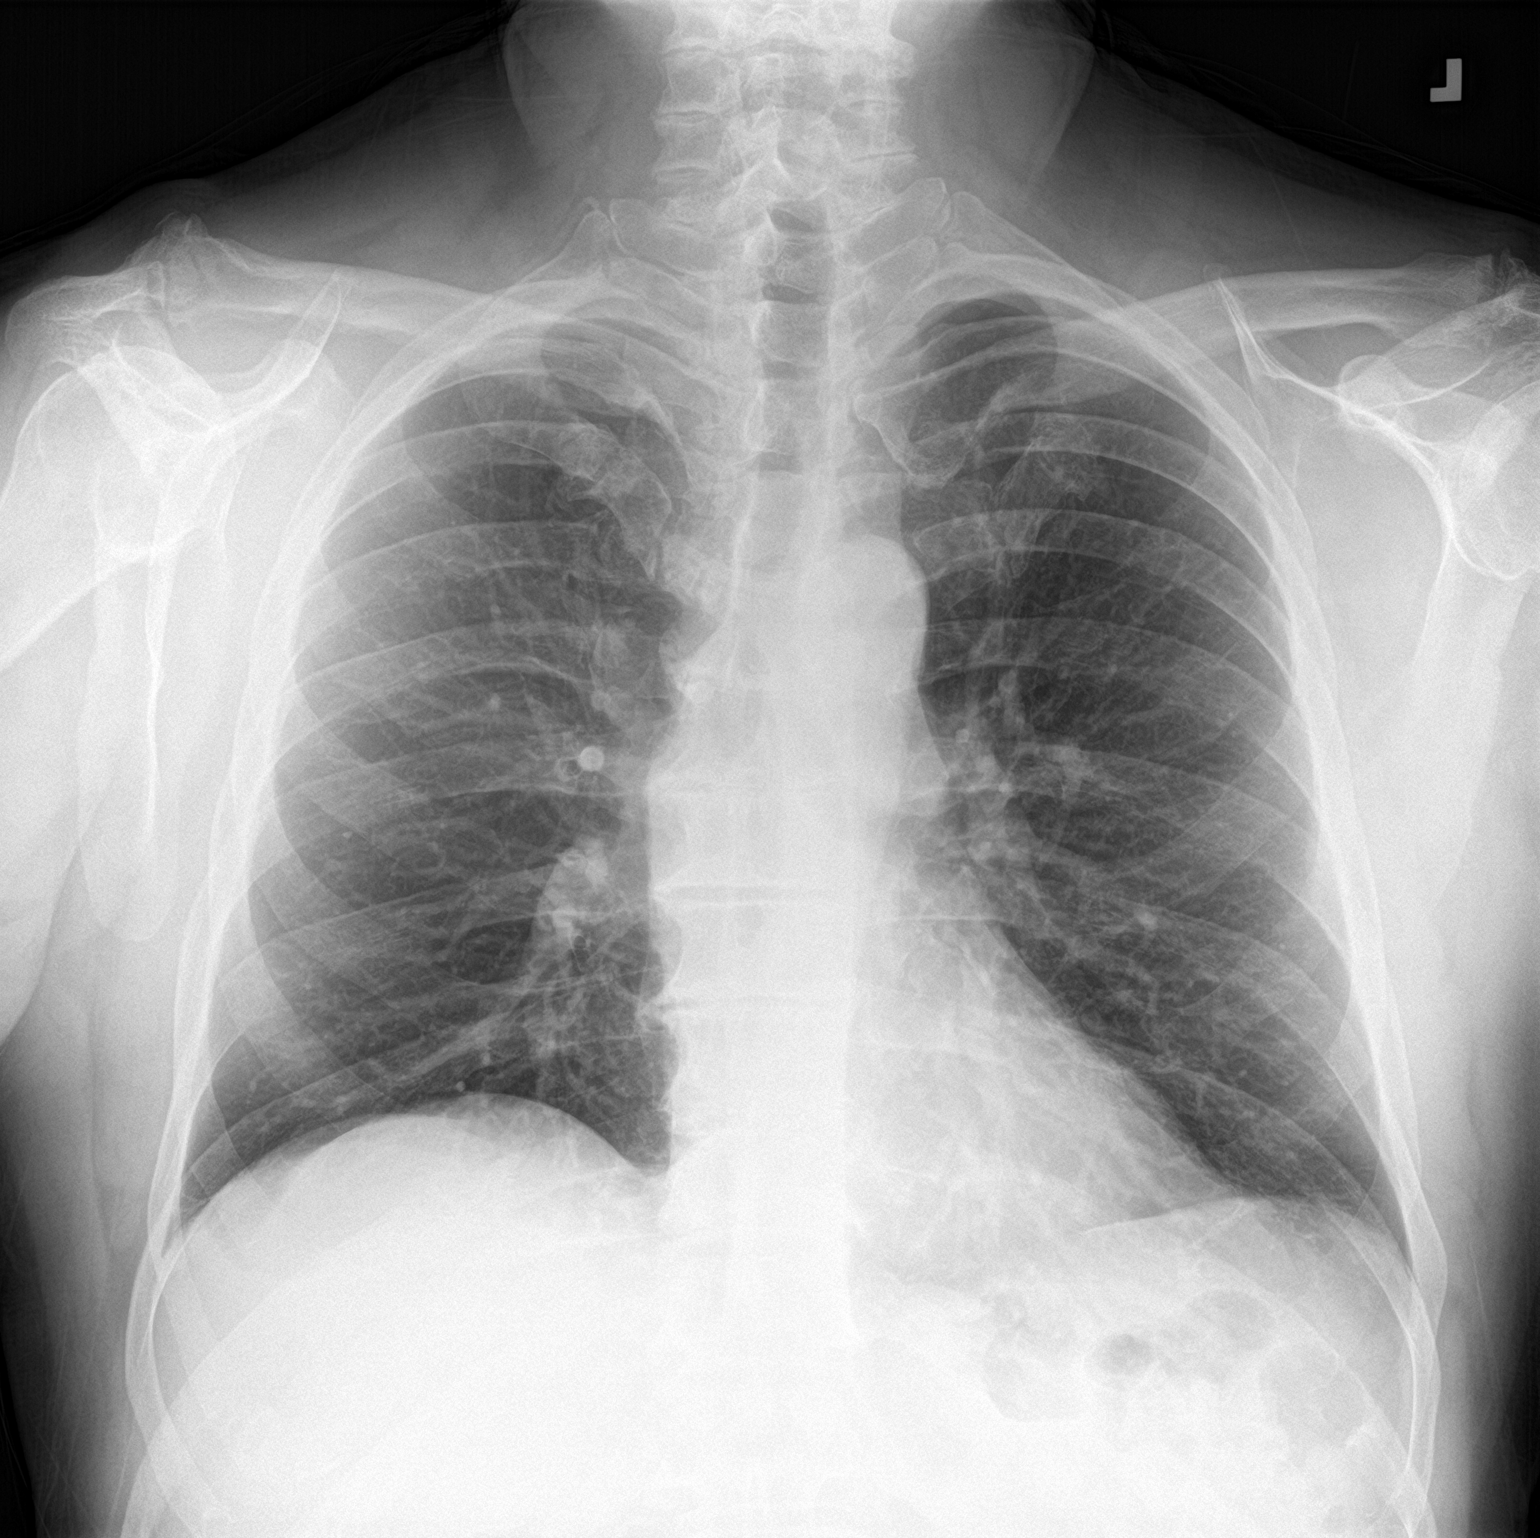

[chest lat]
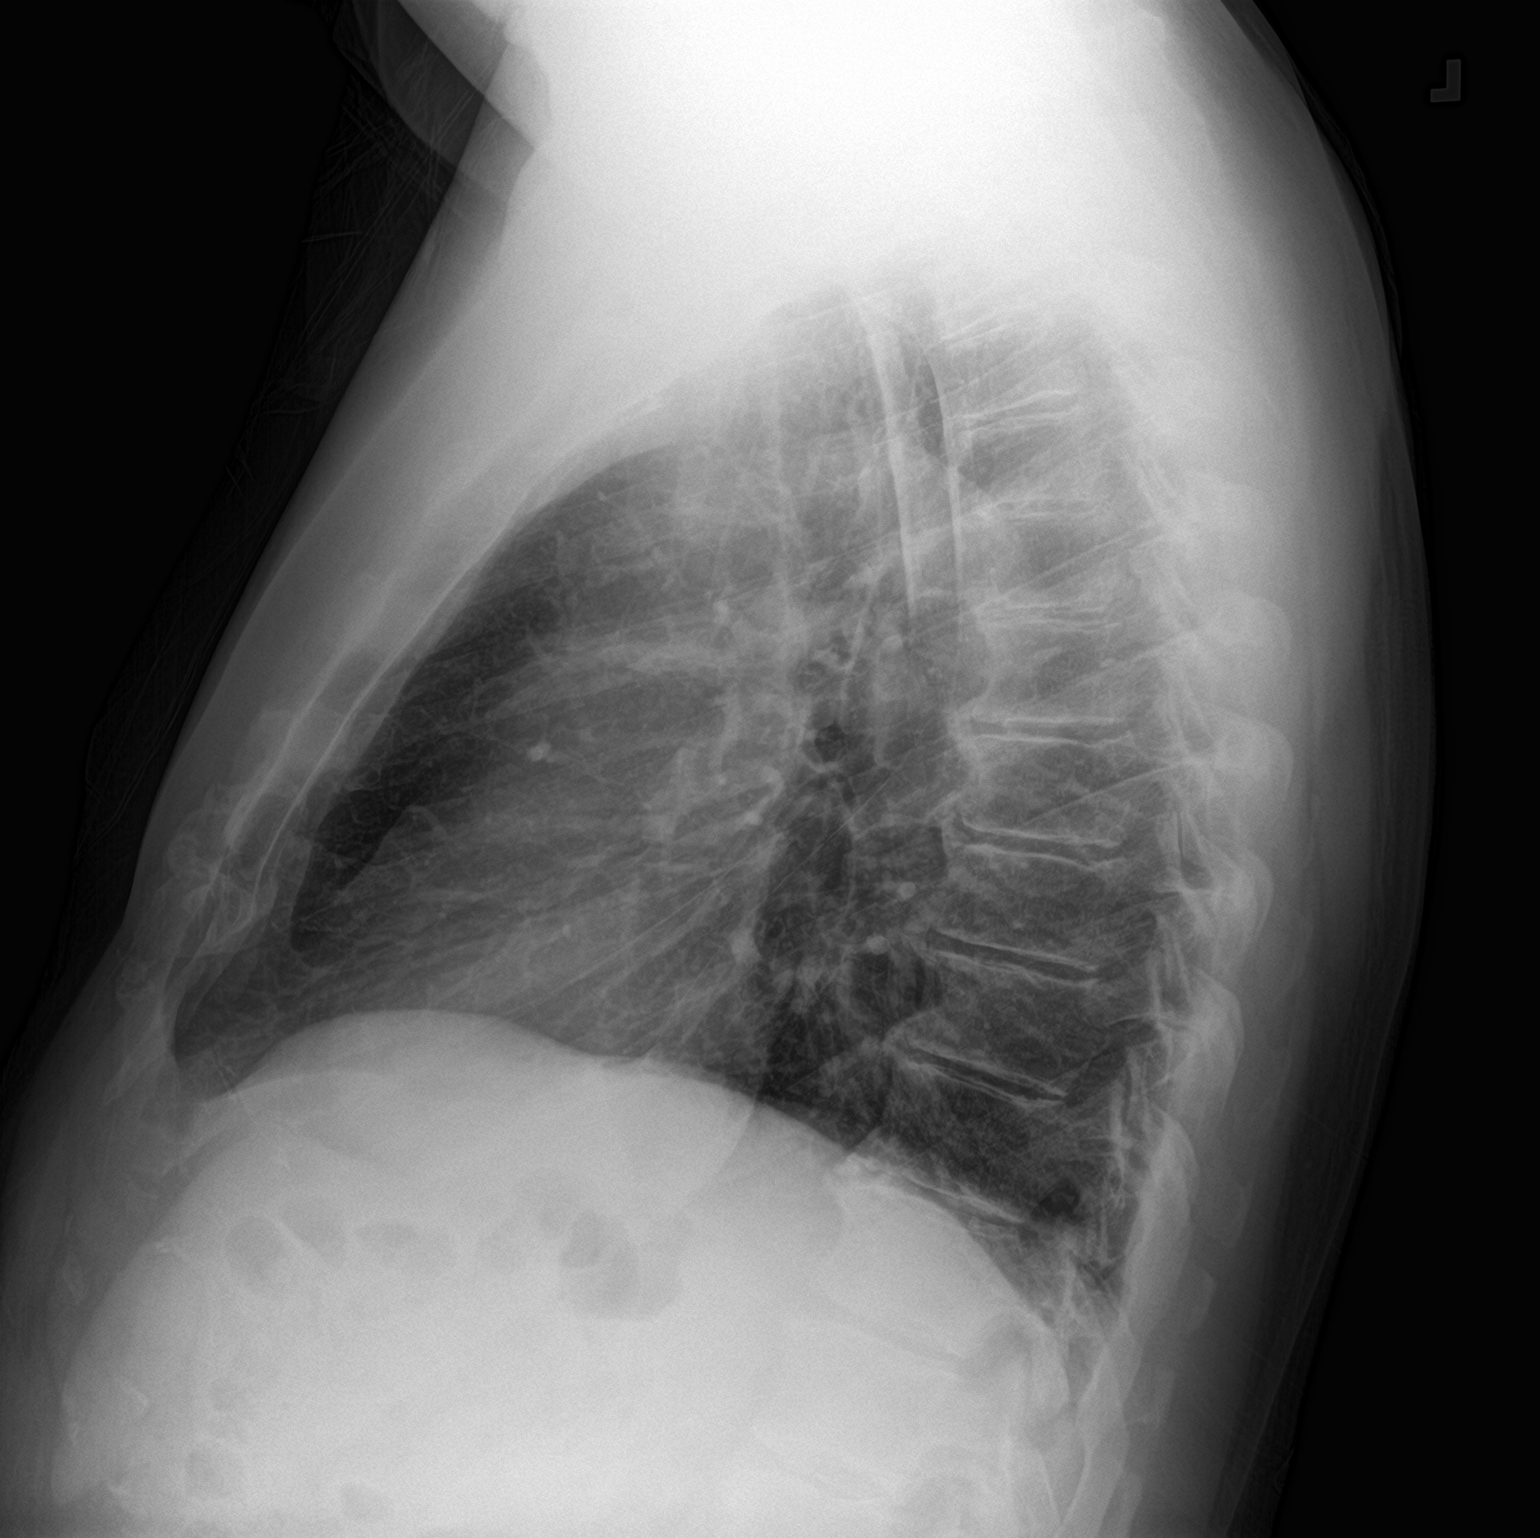

[2 of 2 positions shown; findings below may reference images not displayed]

FINDINGS: The heart size and mediastinal contours are within normal limits.
Both lungs are clear. The visualized skeletal structures are
unremarkable.
IMPRESSION: No active cardiopulmonary disease.

## 2024-04-12 ENCOUNTER — Other Ambulatory Visit (HOSPITAL_COMMUNITY): Payer: Self-pay | Admitting: Internal Medicine

## 2024-04-12 DIAGNOSIS — E782 Mixed hyperlipidemia: Secondary | ICD-10-CM
